# Patient Record
Sex: Female | Born: 1965 | Race: White | Hispanic: No | Marital: Married | Smoking: Never smoker
Health system: Southern US, Community
[De-identification: ages and names within clinical notes are randomized; demographics above are authoritative.]

## PROBLEM LIST (undated history)

## (undated) DIAGNOSIS — R51 Headache: Secondary | ICD-10-CM

## (undated) DIAGNOSIS — N281 Cyst of kidney, acquired: Secondary | ICD-10-CM

## (undated) DIAGNOSIS — Z973 Presence of spectacles and contact lenses: Secondary | ICD-10-CM

## (undated) DIAGNOSIS — N390 Urinary tract infection, site not specified: Secondary | ICD-10-CM

## (undated) DIAGNOSIS — Z87442 Personal history of urinary calculi: Secondary | ICD-10-CM

## (undated) DIAGNOSIS — M1990 Unspecified osteoarthritis, unspecified site: Secondary | ICD-10-CM

## (undated) DIAGNOSIS — E162 Hypoglycemia, unspecified: Secondary | ICD-10-CM

## (undated) DIAGNOSIS — N159 Renal tubulo-interstitial disease, unspecified: Secondary | ICD-10-CM

## (undated) DIAGNOSIS — E039 Hypothyroidism, unspecified: Secondary | ICD-10-CM

## (undated) DIAGNOSIS — D497 Neoplasm of unspecified behavior of endocrine glands and other parts of nervous system: Secondary | ICD-10-CM

## (undated) DIAGNOSIS — R519 Headache, unspecified: Secondary | ICD-10-CM

## (undated) DIAGNOSIS — M419 Scoliosis, unspecified: Secondary | ICD-10-CM

## (undated) DIAGNOSIS — J3089 Other allergic rhinitis: Secondary | ICD-10-CM

## (undated) DIAGNOSIS — K219 Gastro-esophageal reflux disease without esophagitis: Secondary | ICD-10-CM

## (undated) DIAGNOSIS — J189 Pneumonia, unspecified organism: Secondary | ICD-10-CM

## (undated) DIAGNOSIS — R112 Nausea with vomiting, unspecified: Secondary | ICD-10-CM

## (undated) DIAGNOSIS — M4802 Spinal stenosis, cervical region: Secondary | ICD-10-CM

## (undated) DIAGNOSIS — Z9889 Other specified postprocedural states: Secondary | ICD-10-CM

## (undated) DIAGNOSIS — L509 Urticaria, unspecified: Secondary | ICD-10-CM

## (undated) HISTORY — PX: CHOLECYSTECTOMY: SHX55

## (undated) HISTORY — PX: BACK SURGERY: SHX140

## (undated) HISTORY — PX: WISDOM TOOTH EXTRACTION: SHX21

## (undated) HISTORY — PX: NASAL SINUS SURGERY: SHX719

## (undated) HISTORY — PX: TYMPANOSTOMY TUBE PLACEMENT: SHX32

## (undated) HISTORY — PX: ADRENALECTOMY: SHX876

## (undated) HISTORY — PX: TONSILLECTOMY: SUR1361

## (undated) HISTORY — PX: ABDOMINAL HYSTERECTOMY: SHX81

## (undated) HISTORY — PX: OTHER SURGICAL HISTORY: SHX169

---

## 2016-10-28 ENCOUNTER — Other Ambulatory Visit: Payer: Self-pay | Admitting: Neurological Surgery

## 2016-10-28 DIAGNOSIS — M5412 Radiculopathy, cervical region: Secondary | ICD-10-CM

## 2016-11-10 ENCOUNTER — Other Ambulatory Visit: Payer: Self-pay

## 2016-11-11 ENCOUNTER — Ambulatory Visit
Admission: RE | Admit: 2016-11-11 | Discharge: 2016-11-11 | Disposition: A | Discharge status: Home / Self Care | Payer: No Typology Code available for payment source | Source: Ambulatory Visit | Attending: Neurological Surgery | Admitting: Neurological Surgery

## 2016-11-11 ENCOUNTER — Ambulatory Visit
Admission: RE | Admit: 2016-11-11 | Discharge: 2016-11-11 | Disposition: A | Discharge status: Home / Self Care | Payer: Self-pay | Source: Ambulatory Visit | Attending: Neurological Surgery | Admitting: Neurological Surgery

## 2016-11-11 DIAGNOSIS — M5412 Radiculopathy, cervical region: Secondary | ICD-10-CM

## 2016-11-11 MED ORDER — IOPAMIDOL (ISOVUE-M 300) INJECTION 61%
10.0000 mL | Freq: Once | INTRAMUSCULAR | Status: AC | PRN
  Administered 2016-11-11: 10 mL via INTRATHECAL

## 2016-11-11 MED ORDER — DIAZEPAM 5 MG PO TABS
10.0000 mg | ORAL_TABLET | Freq: Once | ORAL | Status: AC
  Administered 2016-11-11: 10 mg via ORAL

## 2016-11-11 MED ORDER — ONDANSETRON HCL 4 MG/2ML IJ SOLN
4.0000 mg | Freq: Once | INTRAMUSCULAR | Status: AC
  Administered 2016-11-11: 4 mg via INTRAMUSCULAR

## 2016-11-11 MED ORDER — MEPERIDINE HCL 100 MG/ML IJ SOLN
100.0000 mg | Freq: Once | INTRAMUSCULAR | Status: AC
  Administered 2016-11-11: 100 mg via INTRAMUSCULAR

## 2016-11-11 NOTE — Progress Notes (Signed)
Pt states she has been off Contrave for the last 2 days.

## 2016-11-11 NOTE — Discharge Instructions (Signed)
Myelogram Discharge Instructions  1. Go home and rest quietly for the next 24 hours.  It is important to lie flat for the next 24 hours.  Get up only to go to the restroom.  You may lie in the bed or on a couch on your back, your stomach, your left side or your right side.  You may have one pillow under your head.  You may have pillows between your knees while you are on your side or under your knees while you are on your back.  2. DO NOT drive today.  Recline the seat as far back as it will go, while still wearing your seat belt, on the way home.  3. You may get up to go to the bathroom as needed.  You may sit up for 10 minutes to eat.  You may resume your normal diet and medications unless otherwise indicated.  Drink lots of extra fluids today and tomorrow.  4. The incidence of headache, nausea, or vomiting is about 5% (one in 20 patients).  If you develop a headache, lie flat and drink plenty of fluids until the headache goes away.  Caffeinated beverages may be helpful.  If you develop severe nausea and vomiting or a headache that does not go away with flat bed rest, call [phone].  5. You may resume normal activities after your 24 hours of bed rest is over; however, do not exert yourself strongly or do any heavy lifting tomorrow. If when you get up you have a headache when standing, go back to bed and force fluids for another 24 hours.  6. Call your physician for a follow-up appointment.  The results of your myelogram will be sent directly to your physician by the following day.  7. If you have any questions or if complications develop after you arrive home, please call [phone].  Discharge instructions have been explained to the patient.  The patient, or the person responsible for the patient, fully understands these instructions.       May resume Contrave on Oct. 12, 2018, after 1:00 pm.

## 2016-11-16 ENCOUNTER — Other Ambulatory Visit: Payer: Self-pay | Admitting: Neurological Surgery

## 2016-11-29 ENCOUNTER — Other Ambulatory Visit: Payer: Self-pay | Admitting: Neurological Surgery

## 2016-12-06 ENCOUNTER — Encounter (HOSPITAL_COMMUNITY)
Admission: RE | Admit: 2016-12-06 | Discharge: 2016-12-06 | Disposition: A | Discharge status: Home / Self Care | Payer: Worker's Compensation | Source: Ambulatory Visit | Attending: Neurological Surgery | Admitting: Neurological Surgery

## 2016-12-06 ENCOUNTER — Other Ambulatory Visit: Payer: Self-pay

## 2016-12-06 ENCOUNTER — Ambulatory Visit (HOSPITAL_COMMUNITY)
Admission: RE | Admit: 2016-12-06 | Discharge: 2016-12-06 | Disposition: A | Discharge status: Home / Self Care | Payer: Worker's Compensation | Source: Ambulatory Visit | Attending: Neurological Surgery | Admitting: Neurological Surgery

## 2016-12-06 ENCOUNTER — Encounter (HOSPITAL_COMMUNITY): Payer: Self-pay

## 2016-12-06 DIAGNOSIS — Z01812 Encounter for preprocedural laboratory examination: Secondary | ICD-10-CM | POA: Diagnosis present

## 2016-12-06 DIAGNOSIS — Z01810 Encounter for preprocedural cardiovascular examination: Secondary | ICD-10-CM | POA: Insufficient documentation

## 2016-12-06 DIAGNOSIS — M4802 Spinal stenosis, cervical region: Secondary | ICD-10-CM

## 2016-12-06 HISTORY — DX: Renal tubulo-interstitial disease, unspecified: N15.9

## 2016-12-06 HISTORY — DX: Headache: R51

## 2016-12-06 HISTORY — DX: Other specified postprocedural states: R11.2

## 2016-12-06 HISTORY — DX: Pneumonia, unspecified organism: J18.9

## 2016-12-06 HISTORY — DX: Spinal stenosis, cervical region: M48.02

## 2016-12-06 HISTORY — DX: Neoplasm of unspecified behavior of endocrine glands and other parts of nervous system: D49.7

## 2016-12-06 HISTORY — DX: Urticaria, unspecified: L50.9

## 2016-12-06 HISTORY — DX: Other specified postprocedural states: Z98.890

## 2016-12-06 HISTORY — DX: Hypothyroidism, unspecified: E03.9

## 2016-12-06 HISTORY — DX: Headache, unspecified: R51.9

## 2016-12-06 HISTORY — DX: Personal history of urinary calculi: Z87.442

## 2016-12-06 HISTORY — DX: Other allergic rhinitis: J30.89

## 2016-12-06 HISTORY — DX: Hypoglycemia, unspecified: E16.2

## 2016-12-06 HISTORY — DX: Gastro-esophageal reflux disease without esophagitis: K21.9

## 2016-12-06 HISTORY — DX: Urinary tract infection, site not specified: N39.0

## 2016-12-06 HISTORY — DX: Cyst of kidney, acquired: N28.1

## 2016-12-06 HISTORY — DX: Unspecified osteoarthritis, unspecified site: M19.90

## 2016-12-06 HISTORY — DX: Scoliosis, unspecified: M41.9

## 2016-12-06 HISTORY — DX: Presence of spectacles and contact lenses: Z97.3

## 2016-12-06 LAB — TYPE AND SCREEN
ABO/RH(D): A NEG
Antibody Screen: NEGATIVE

## 2016-12-06 LAB — BASIC METABOLIC PANEL
Anion gap: 9 (ref 5–15)
BUN: 15 mg/dL (ref 6–20)
CALCIUM: 8.8 mg/dL — AB (ref 8.9–10.3)
CO2: 24 mmol/L (ref 22–32)
CREATININE: 0.92 mg/dL (ref 0.44–1.00)
Chloride: 105 mmol/L (ref 101–111)
GFR calc Af Amer: >60 mL/min (ref >60)
Glucose, Bld: 104 mg/dL — ABNORMAL HIGH (ref 65–99)
POTASSIUM: 4 mmol/L (ref 3.5–5.1)
SODIUM: 138 mmol/L (ref 135–145)

## 2016-12-06 LAB — CBC WITH DIFFERENTIAL/PLATELET
Basophils Absolute: 0 10*3/uL (ref 0.0–0.1)
Basophils Relative: 0 %
EOS PCT: 1 %
Eosinophils Absolute: 0.1 10*3/uL (ref 0.0–0.7)
HEMATOCRIT: 41.8 % (ref 36.0–46.0)
Hemoglobin: 14.3 g/dL (ref 12.0–15.0)
Lymphocytes Relative: 40 %
Lymphs Abs: 2.9 10*3/uL (ref 0.7–4.0)
MCH: 29.9 pg (ref 26.0–34.0)
MCHC: 34.2 g/dL (ref 30.0–36.0)
MCV: 87.3 fL (ref 78.0–100.0)
MONO ABS: 0.5 10*3/uL (ref 0.1–1.0)
Monocytes Relative: 7 %
Neutro Abs: 3.8 10*3/uL (ref 1.7–7.7)
Neutrophils Relative %: 52 %
PLATELETS: 283 10*3/uL (ref 150–400)
RBC: 4.79 MIL/uL (ref 3.87–5.11)
RDW: 12.8 % (ref 11.5–15.5)
WBC: 7.3 10*3/uL (ref 4.0–10.5)

## 2016-12-06 LAB — SURGICAL PCR SCREEN
MRSA, PCR: NEGATIVE
STAPHYLOCOCCUS AUREUS: NEGATIVE

## 2016-12-06 LAB — ABO/RH: ABO/RH(D): A NEG

## 2016-12-06 LAB — PROTIME-INR
INR: 0.92
Prothrombin Time: 12.3 seconds (ref 11.4–15.2)

## 2016-12-06 NOTE — Pre-Procedure Instructions (Signed)
    Paige Hess  12/06/2016      [facility] [place], [address] [place] [address] Phone: [phone] Fax: [phone]    Your procedure is scheduled on Thursday, December 09, 2016  Report to [facility] Admitting at 7:30 A.M.  Call this number if you have problems the morning of surgery:  [phone]   Remember:  Do not eat food or drink liquids after midnight Wednesday, November 7,  Take these medicines the morning of surgery with A SIP OF WATER : cetirizine (ZYRTEC), estradiol (ESTRACE), levothyroxine (SYNTHROID), montelukast (SINGULAIR), ranitidine (ZANTAC),  Stop taking  Naltrexone-Bupropion HCl ER (CONTRAVE),  Aspirin, vitamins, Omega-3 Fatty Acids (FISH OIL), Biotin  and herbal medications. Do not take any NSAIDs ie: Ibuprofen, Advil, Naproxen (NAPROSYN/ Aleve), BC and Goody Powder; stop now.  Do not wear jewelry, make-up or nail polish.  Do not wear lotions, powders, or perfumes, or deodorant.  Do not shave 48 hours prior to surgery.    Do not bring valuables to the hospital.  [facility] is not responsible for any belongings or valuables.  Contacts, dentures or bridgework may not be worn into surgery.  Leave your suitcase in the car.  After surgery it may be brought to your room. For patients admitted to the hospital, discharge time will be determined by your treatment team. Special instructions: Shower the night before surgery and the morning of surgery with CHG. Please read over the following fact sheets that you were given. Pain Booklet, Coughing and Deep Breathing, Blood Transfusion Information and Surgical Site Infection Prevention

## 2016-12-06 NOTE — Progress Notes (Signed)
Pt denies SOB, chest pain, and being under the care of a cardiologist. Pt denies having a stress test, echo and cardiac cath. Pt denies having an EKG and chest x ray within the last year. Pt denies recent labs.  

## 2016-12-09 ENCOUNTER — Observation Stay (HOSPITAL_COMMUNITY)
Admission: RE | Admit: 2016-12-09 | Discharge: 2016-12-10 | Disposition: A | Discharge status: Home / Self Care | Payer: Worker's Compensation | Source: Ambulatory Visit | Attending: Neurological Surgery | Admitting: Neurological Surgery

## 2016-12-09 ENCOUNTER — Encounter (HOSPITAL_COMMUNITY): Payer: Self-pay | Admitting: Neurological Surgery

## 2016-12-09 ENCOUNTER — Inpatient Hospital Stay (HOSPITAL_COMMUNITY): Payer: Worker's Compensation | Admitting: Anesthesiology

## 2016-12-09 ENCOUNTER — Inpatient Hospital Stay (HOSPITAL_COMMUNITY): Payer: Worker's Compensation

## 2016-12-09 ENCOUNTER — Encounter (HOSPITAL_COMMUNITY)
Admission: RE | Disposition: A | Discharge status: Home / Self Care | Payer: Self-pay | Source: Ambulatory Visit | Attending: Neurological Surgery

## 2016-12-09 DIAGNOSIS — M479 Spondylosis, unspecified: Secondary | ICD-10-CM | POA: Diagnosis not present

## 2016-12-09 DIAGNOSIS — Z79890 Hormone replacement therapy (postmenopausal): Secondary | ICD-10-CM | POA: Insufficient documentation

## 2016-12-09 DIAGNOSIS — K219 Gastro-esophageal reflux disease without esophagitis: Secondary | ICD-10-CM | POA: Diagnosis not present

## 2016-12-09 DIAGNOSIS — M50221 Other cervical disc displacement at C4-C5 level: Secondary | ICD-10-CM | POA: Diagnosis not present

## 2016-12-09 DIAGNOSIS — Z79899 Other long term (current) drug therapy: Secondary | ICD-10-CM | POA: Insufficient documentation

## 2016-12-09 DIAGNOSIS — E039 Hypothyroidism, unspecified: Secondary | ICD-10-CM | POA: Diagnosis not present

## 2016-12-09 DIAGNOSIS — M4803 Spinal stenosis, cervicothoracic region: Secondary | ICD-10-CM | POA: Diagnosis not present

## 2016-12-09 DIAGNOSIS — M5013 Cervical disc disorder with radiculopathy, cervicothoracic region: Principal | ICD-10-CM | POA: Insufficient documentation

## 2016-12-09 DIAGNOSIS — Z791 Long term (current) use of non-steroidal anti-inflammatories (NSAID): Secondary | ICD-10-CM | POA: Insufficient documentation

## 2016-12-09 DIAGNOSIS — M4322 Fusion of spine, cervical region: Secondary | ICD-10-CM | POA: Diagnosis present

## 2016-12-09 DIAGNOSIS — M542 Cervicalgia: Secondary | ICD-10-CM | POA: Diagnosis present

## 2016-12-09 DIAGNOSIS — Z419 Encounter for procedure for purposes other than remedying health state, unspecified: Secondary | ICD-10-CM

## 2016-12-09 HISTORY — PX: POSTERIOR CERVICAL FUSION/FORAMINOTOMY: SHX5038

## 2016-12-09 SURGERY — POSTERIOR CERVICAL FUSION/FORAMINOTOMY LEVEL 1
Anesthesia: General | Laterality: Left

## 2016-12-09 MED ORDER — BUPIVACAINE HCL (PF) 0.25 % IJ SOLN
INTRAMUSCULAR | Status: DC | PRN
  Administered 2016-12-09: 5 mL

## 2016-12-09 MED ORDER — POTASSIUM CHLORIDE IN NACL 20-0.9 MEQ/L-% IV SOLN: INTRAVENOUS | Status: DC

## 2016-12-09 MED ORDER — HYDROMORPHONE HCL 1 MG/ML IJ SOLN: 1.0000 mg | Freq: Once | INTRAMUSCULAR | Status: DC

## 2016-12-09 MED ORDER — ONDANSETRON HCL 4 MG/2ML IJ SOLN: 4.0000 mg | Freq: Four times a day (QID) | INTRAMUSCULAR | Status: DC | PRN

## 2016-12-09 MED ORDER — ONDANSETRON HCL 4 MG PO TABS: 4.0000 mg | ORAL_TABLET | Freq: Four times a day (QID) | ORAL | Status: DC | PRN

## 2016-12-09 MED ORDER — 0.9 % SODIUM CHLORIDE (POUR BTL) OPTIME
TOPICAL | Status: DC | PRN
  Administered 2016-12-09: 1000 mL

## 2016-12-09 MED ORDER — FENTANYL CITRATE (PF) 250 MCG/5ML IJ SOLN
INTRAMUSCULAR | Status: AC
  Filled 2016-12-09: qty 5

## 2016-12-09 MED ORDER — ONDANSETRON HCL 4 MG/2ML IJ SOLN
INTRAMUSCULAR | Status: DC | PRN
  Administered 2016-12-09: 4 mg via INTRAVENOUS

## 2016-12-09 MED ORDER — SCOPOLAMINE 1 MG/3DAYS TD PT72
MEDICATED_PATCH | TRANSDERMAL | Status: AC
  Filled 2016-12-09: qty 1

## 2016-12-09 MED ORDER — SODIUM CHLORIDE 0.9 % IV SOLN: 250.0000 mL | INTRAVENOUS | Status: DC

## 2016-12-09 MED ORDER — LACTATED RINGERS IV SOLN
INTRAVENOUS | Status: DC | PRN
  Administered 2016-12-09 (×2): via INTRAVENOUS

## 2016-12-09 MED ORDER — MENTHOL 3 MG MT LOZG: 1.0000 | LOZENGE | OROMUCOSAL | Status: DC | PRN

## 2016-12-09 MED ORDER — THROMBIN (RECOMBINANT) 5000 UNITS EX SOLR
CUTANEOUS | Status: AC
  Filled 2016-12-09: qty 5000

## 2016-12-09 MED ORDER — ACETAMINOPHEN 650 MG RE SUPP: 650.0000 mg | RECTAL | Status: DC | PRN

## 2016-12-09 MED ORDER — PROPOFOL 10 MG/ML IV BOLUS
INTRAVENOUS | Status: AC
  Filled 2016-12-09: qty 20

## 2016-12-09 MED ORDER — DEXAMETHASONE 4 MG PO TABS
4.0000 mg | ORAL_TABLET | Freq: Four times a day (QID) | ORAL | Status: DC
  Administered 2016-12-09 – 2016-12-10 (×2): 4 mg via ORAL
  Filled 2016-12-09 (×2): qty 1

## 2016-12-09 MED ORDER — LEVOTHYROXINE SODIUM 100 MCG PO TABS
100.0000 ug | ORAL_TABLET | Freq: Every day | ORAL | Status: DC
  Administered 2016-12-10: 100 ug via ORAL
  Filled 2016-12-09: qty 1

## 2016-12-09 MED ORDER — SODIUM CHLORIDE 0.9% FLUSH: 3.0000 mL | INTRAVENOUS | Status: DC | PRN

## 2016-12-09 MED ORDER — HYDROCODONE-ACETAMINOPHEN 7.5-325 MG PO TABS
1.0000 | ORAL_TABLET | ORAL | Status: DC | PRN
  Administered 2016-12-09 – 2016-12-10 (×6): 1 via ORAL
  Filled 2016-12-09 (×5): qty 1

## 2016-12-09 MED ORDER — ESTRADIOL 1 MG PO TABS
1.0000 mg | ORAL_TABLET | Freq: Every day | ORAL | Status: DC
  Administered 2016-12-10: 1 mg via ORAL
  Filled 2016-12-09: qty 1

## 2016-12-09 MED ORDER — CEFAZOLIN SODIUM-DEXTROSE 2-4 GM/100ML-% IV SOLN
2.0000 g | INTRAVENOUS | Status: AC
  Administered 2016-12-09: 2 g via INTRAVENOUS
  Filled 2016-12-09: qty 100

## 2016-12-09 MED ORDER — GELATIN ABSORBABLE MT POWD
OROMUCOSAL | Status: DC | PRN
  Administered 2016-12-09: 12:00:00 via TOPICAL

## 2016-12-09 MED ORDER — LIDOCAINE HCL (CARDIAC) 20 MG/ML IV SOLN
INTRAVENOUS | Status: DC | PRN
  Administered 2016-12-09: 100 mg via INTRAVENOUS

## 2016-12-09 MED ORDER — MORPHINE SULFATE (PF) 4 MG/ML IV SOLN: 2.0000 mg | INTRAVENOUS | Status: DC | PRN

## 2016-12-09 MED ORDER — METHOCARBAMOL 1000 MG/10ML IJ SOLN: 500.0000 mg | Freq: Four times a day (QID) | INTRAVENOUS | Status: DC | PRN

## 2016-12-09 MED ORDER — DEXAMETHASONE SODIUM PHOSPHATE 4 MG/ML IJ SOLN
4.0000 mg | Freq: Four times a day (QID) | INTRAMUSCULAR | Status: DC
  Administered 2016-12-09: 4 mg via INTRAVENOUS
  Filled 2016-12-09: qty 1

## 2016-12-09 MED ORDER — DEXTROSE 5 % IV SOLN: INTRAVENOUS | Status: DC | PRN

## 2016-12-09 MED ORDER — FENTANYL CITRATE (PF) 100 MCG/2ML IJ SOLN
INTRAMUSCULAR | Status: AC
  Filled 2016-12-09: qty 2

## 2016-12-09 MED ORDER — ERYTHROMYCIN BASE 250 MG PO TABS
250.0000 mg | ORAL_TABLET | Freq: Every day | ORAL | Status: DC
  Administered 2016-12-10: 250 mg via ORAL
  Filled 2016-12-09: qty 1

## 2016-12-09 MED ORDER — MONTELUKAST SODIUM 10 MG PO TABS
10.0000 mg | ORAL_TABLET | Freq: Every day | ORAL | Status: DC
Start: 2016-12-10 — End: 2016-12-10
  Administered 2016-12-10: 10 mg via ORAL
  Filled 2016-12-09: qty 1

## 2016-12-09 MED ORDER — HYDROMORPHONE HCL 1 MG/ML IJ SOLN
INTRAMUSCULAR | Status: AC
  Filled 2016-12-09: qty 1

## 2016-12-09 MED ORDER — DIAZEPAM 5 MG/ML IJ SOLN
INTRAMUSCULAR | Status: AC
  Filled 2016-12-09: qty 2

## 2016-12-09 MED ORDER — PHENOL 1.4 % MT LIQD: 1.0000 | OROMUCOSAL | Status: DC | PRN

## 2016-12-09 MED ORDER — MIDAZOLAM HCL 5 MG/5ML IJ SOLN
INTRAMUSCULAR | Status: DC | PRN
  Administered 2016-12-09: 2 mg via INTRAVENOUS

## 2016-12-09 MED ORDER — CHLORHEXIDINE GLUCONATE CLOTH 2 % EX PADS: 6.0000 | MEDICATED_PAD | Freq: Once | CUTANEOUS | Status: DC

## 2016-12-09 MED ORDER — PROPOFOL 10 MG/ML IV BOLUS
INTRAVENOUS | Status: DC | PRN
  Administered 2016-12-09: 180 mg via INTRAVENOUS

## 2016-12-09 MED ORDER — HYDROMORPHONE HCL 1 MG/ML IJ SOLN
0.2500 mg | INTRAMUSCULAR | Status: DC | PRN
  Administered 2016-12-09: 1 mg via INTRAVENOUS

## 2016-12-09 MED ORDER — SODIUM CHLORIDE 0.9% FLUSH
3.0000 mL | Freq: Two times a day (BID) | INTRAVENOUS | Status: DC
  Administered 2016-12-09: 3 mL via INTRAVENOUS

## 2016-12-09 MED ORDER — CEFAZOLIN SODIUM-DEXTROSE 2-4 GM/100ML-% IV SOLN
2.0000 g | Freq: Three times a day (TID) | INTRAVENOUS | Status: AC
  Administered 2016-12-09 – 2016-12-10 (×2): 2 g via INTRAVENOUS
  Filled 2016-12-09 (×2): qty 100

## 2016-12-09 MED ORDER — FENTANYL CITRATE (PF) 100 MCG/2ML IJ SOLN
INTRAMUSCULAR | Status: DC | PRN
  Administered 2016-12-09: 150 ug via INTRAVENOUS
  Administered 2016-12-09 (×2): 50 ug via INTRAVENOUS

## 2016-12-09 MED ORDER — PHENYLEPHRINE HCL 10 MG/ML IJ SOLN
INTRAMUSCULAR | Status: DC | PRN
  Administered 2016-12-09: 40 ug via INTRAVENOUS
  Administered 2016-12-09: 80 ug via INTRAVENOUS
  Administered 2016-12-09: 40 ug via INTRAVENOUS

## 2016-12-09 MED ORDER — BUPIVACAINE HCL (PF) 0.25 % IJ SOLN
INTRAMUSCULAR | Status: AC
  Filled 2016-12-09: qty 30

## 2016-12-09 MED ORDER — VANCOMYCIN HCL 1000 MG IV SOLR
INTRAVENOUS | Status: AC
  Filled 2016-12-09: qty 1000

## 2016-12-09 MED ORDER — ROCURONIUM BROMIDE 100 MG/10ML IV SOLN
INTRAVENOUS | Status: DC | PRN
  Administered 2016-12-09: 20 mg via INTRAVENOUS
  Administered 2016-12-09: 70 mg via INTRAVENOUS
  Administered 2016-12-09 (×2): 10 mg via INTRAVENOUS

## 2016-12-09 MED ORDER — METHOCARBAMOL 500 MG PO TABS
ORAL_TABLET | ORAL | Status: AC
  Filled 2016-12-09: qty 1

## 2016-12-09 MED ORDER — SENNA 8.6 MG PO TABS
1.0000 | ORAL_TABLET | Freq: Two times a day (BID) | ORAL | Status: DC
  Administered 2016-12-09: 8.6 mg via ORAL
  Filled 2016-12-09: qty 1

## 2016-12-09 MED ORDER — SUGAMMADEX SODIUM 200 MG/2ML IV SOLN
INTRAVENOUS | Status: DC | PRN
  Administered 2016-12-09: 400 mg via INTRAVENOUS

## 2016-12-09 MED ORDER — DEXAMETHASONE SODIUM PHOSPHATE 10 MG/ML IJ SOLN
10.0000 mg | INTRAMUSCULAR | Status: AC
  Administered 2016-12-09: 10 mg via INTRAVENOUS
  Filled 2016-12-09: qty 1

## 2016-12-09 MED ORDER — DIAZEPAM 5 MG/ML IJ SOLN
2.5000 mg | Freq: Once | INTRAMUSCULAR | Status: AC
  Administered 2016-12-09: 2.5 mg via INTRAVENOUS

## 2016-12-09 MED ORDER — DEXTROSE 5 % IV SOLN
INTRAVENOUS | Status: DC | PRN
  Administered 2016-12-09: 25 ug/min via INTRAVENOUS

## 2016-12-09 MED ORDER — TRIMETHOPRIM 100 MG PO TABS
100.0000 mg | ORAL_TABLET | Freq: Every day | ORAL | Status: DC
  Administered 2016-12-10: 100 mg via ORAL
  Filled 2016-12-09 (×2): qty 1

## 2016-12-09 MED ORDER — ACETAMINOPHEN 325 MG PO TABS
650.0000 mg | ORAL_TABLET | ORAL | Status: DC | PRN
Start: 2016-12-09 — End: 2016-12-10

## 2016-12-09 MED ORDER — SCOPOLAMINE 1 MG/3DAYS TD PT72
MEDICATED_PATCH | TRANSDERMAL | Status: DC | PRN
  Administered 2016-12-09: 1 via TRANSDERMAL

## 2016-12-09 MED ORDER — METHOCARBAMOL 500 MG PO TABS
500.0000 mg | ORAL_TABLET | Freq: Four times a day (QID) | ORAL | Status: DC | PRN
  Administered 2016-12-09 – 2016-12-10 (×4): 500 mg via ORAL
  Filled 2016-12-09 (×3): qty 1

## 2016-12-09 MED ORDER — HYDROCODONE-ACETAMINOPHEN 7.5-325 MG PO TABS
ORAL_TABLET | ORAL | Status: AC
  Filled 2016-12-09: qty 1

## 2016-12-09 MED ORDER — SODIUM CHLORIDE 0.9 % IR SOLN
Status: DC | PRN
  Administered 2016-12-09: 12:00:00

## 2016-12-09 MED ORDER — NALTREXONE-BUPROPION HCL ER 8-90 MG PO TB12: 2.0000 | ORAL_TABLET | Freq: Every day | ORAL | Status: DC

## 2016-12-09 MED ORDER — FENTANYL CITRATE (PF) 100 MCG/2ML IJ SOLN
25.0000 ug | INTRAMUSCULAR | Status: DC | PRN
  Administered 2016-12-09 (×2): 25 ug via INTRAVENOUS

## 2016-12-09 MED ORDER — MIDAZOLAM HCL 2 MG/2ML IJ SOLN
INTRAMUSCULAR | Status: AC
  Filled 2016-12-09: qty 2

## 2016-12-09 SURGICAL SUPPLY — 53 items
BAG DECANTER FOR FLEXI CONT (MISCELLANEOUS) ×2 IMPLANT
BENZOIN TINCTURE PRP APPL 2/3 (GAUZE/BANDAGES/DRESSINGS) ×2 IMPLANT
BLADE CLIPPER SURG (BLADE) IMPLANT
BUR MATCHSTICK NEURO 3.0 LAGG (BURR) IMPLANT
CANISTER SUCT 3000ML PPV (MISCELLANEOUS) ×2 IMPLANT
CAP LOCKING (Cap) ×2 IMPLANT
CARTRIDGE OIL MAESTRO DRILL (MISCELLANEOUS) ×1 IMPLANT
DIFFUSER DRILL AIR PNEUMATIC (MISCELLANEOUS) ×2 IMPLANT
DRAPE C-ARM 42X72 X-RAY (DRAPES) ×4 IMPLANT
DRAPE LAPAROTOMY 100X72 PEDS (DRAPES) ×2 IMPLANT
DRAPE POUCH INSTRU U-SHP 10X18 (DRAPES) ×2 IMPLANT
DRSG OPSITE POSTOP 3X4 (GAUZE/BANDAGES/DRESSINGS) ×2 IMPLANT
DURAPREP 26ML APPLICATOR (WOUND CARE) ×2 IMPLANT
ELECT REM PT RETURN 9FT ADLT (ELECTROSURGICAL) ×2
ELECTRODE REM PT RTRN 9FT ADLT (ELECTROSURGICAL) ×1 IMPLANT
EVACUATOR 1/8 PVC DRAIN (DRAIN) IMPLANT
GAUZE SPONGE 4X4 16PLY XRAY LF (GAUZE/BANDAGES/DRESSINGS) IMPLANT
GLOVE BIO SURGEON STRL SZ7 (GLOVE) IMPLANT
GLOVE BIO SURGEON STRL SZ8 (GLOVE) ×2 IMPLANT
GLOVE BIOGEL PI IND STRL 7.0 (GLOVE) IMPLANT
GLOVE BIOGEL PI INDICATOR 7.0 (GLOVE)
GOWN STRL REUS W/ TWL LRG LVL3 (GOWN DISPOSABLE) IMPLANT
GOWN STRL REUS W/ TWL XL LVL3 (GOWN DISPOSABLE) ×1 IMPLANT
GOWN STRL REUS W/TWL 2XL LVL3 (GOWN DISPOSABLE) IMPLANT
GOWN STRL REUS W/TWL LRG LVL3 (GOWN DISPOSABLE)
GOWN STRL REUS W/TWL XL LVL3 (GOWN DISPOSABLE) ×1
HEMOSTAT POWDER KIT SURGIFOAM (HEMOSTASIS) ×2 IMPLANT
IMPL QUARTEX 3.5X14MM (Neuro Prosthesis/Implant) ×1 IMPLANT
IMPLANT QUARTEX 3.5X14MM (Neuro Prosthesis/Implant) ×2 IMPLANT
KIT BASIN OR (CUSTOM PROCEDURE TRAY) ×2 IMPLANT
KIT ROOM TURNOVER OR (KITS) ×2 IMPLANT
LOCKING CAP (Cap) ×4 IMPLANT
MARKER SKIN DUAL TIP RULER LAB (MISCELLANEOUS) ×2 IMPLANT
NEEDLE HYPO 25X1 1.5 SAFETY (NEEDLE) ×2 IMPLANT
NEEDLE SPNL 20GX3.5 QUINCKE YW (NEEDLE) IMPLANT
NS IRRIG 1000ML POUR BTL (IV SOLUTION) ×2 IMPLANT
OIL CARTRIDGE MAESTRO DRILL (MISCELLANEOUS) ×2
PACK LAMINECTOMY NEURO (CUSTOM PROCEDURE TRAY) ×2 IMPLANT
PAD ARMBOARD 7.5X6 YLW CONV (MISCELLANEOUS) ×2 IMPLANT
PIN MAYFIELD SKULL DISP (PIN) ×2 IMPLANT
PUTTY DBM ALLOSYNC PURE 5CC (Putty) ×2 IMPLANT
ROD ELLIPSE 40MMX3.5 (Rod) ×2 IMPLANT
SCREW QUARTEX 4.0X24MM POLY (Screw) ×4 IMPLANT
SPONGE LAP 4X18 X RAY DECT (DISPOSABLE) IMPLANT
SPONGE SURGIFOAM ABS GEL SZ50 (HEMOSTASIS) IMPLANT
STRIP CLOSURE SKIN 1/2X4 (GAUZE/BANDAGES/DRESSINGS) ×2 IMPLANT
SUT VIC AB 0 CT1 18XCR BRD8 (SUTURE) ×1 IMPLANT
SUT VIC AB 0 CT1 8-18 (SUTURE) ×1
SUT VIC AB 2-0 CP2 18 (SUTURE) ×2 IMPLANT
SUT VIC AB 3-0 SH 8-18 (SUTURE) ×2 IMPLANT
TOWEL GREEN STERILE (TOWEL DISPOSABLE) ×2 IMPLANT
TOWEL GREEN STERILE FF (TOWEL DISPOSABLE) ×2 IMPLANT
WATER STERILE IRR 1000ML POUR (IV SOLUTION) ×2 IMPLANT

## 2016-12-09 NOTE — Transfer of Care (Signed)
Immediate Anesthesia Transfer of Care Note  Patient: Paige Hess  Procedure(s) Performed: Posterior Cervical Fusion with lateral mass fixation - Cervical Seven-Thoracic One, foraminotomy Cerival Seven-Thoracic One left (Left )  Patient Location: PACU  Anesthesia Type:General  Level of Consciousness: awake, alert  and oriented  Airway & Oxygen Therapy: Patient Spontanous Breathing and Patient connected to nasal cannula oxygen  Post-op Assessment: Report given to RN, Post -op Vital signs reviewed and stable and Patient moving all extremities X 4  Post vital signs: Reviewed and stable  Last Vitals:  Vitals:   12/09/16 0754  BP: (!) 137/91  Pulse: 81  Temp: 36.6 C  SpO2: 100%    Last Pain:  Vitals:   12/09/16 0832  TempSrc:   PainSc: 4          Complications: No apparent anesthesia complications

## 2016-12-09 NOTE — Anesthesia Preprocedure Evaluation (Addendum)
Anesthesia Evaluation  Patient identified by MRN, date of birth, ID band Patient awake    History of Anesthesia Complications (+) PONV  Airway Mallampati: II  TM Distance: >3 FB Neck ROM: Full    Dental  (+) Teeth Intact, Dental Advisory Given   Pulmonary pneumonia,    breath sounds clear to auscultation       Cardiovascular negative cardio ROS   Rhythm:Regular Rate:Normal     Neuro/Psych  Headaches,    GI/Hepatic GERD  ,  Endo/Other  Hypothyroidism   Renal/GU Renal disease     Musculoskeletal  (+) Arthritis ,   Abdominal   Peds  Hematology   Anesthesia Other Findings   Reproductive/Obstetrics                           Anesthesia Physical Anesthesia Plan  ASA: III  Anesthesia Plan: General   Post-op Pain Management:    Induction: Intravenous  PONV Risk Score and Plan: 3 and Treatment may vary due to age or medical condition, Ondansetron, Dexamethasone and Propofol infusion  Airway Management Planned: Oral ETT  Additional Equipment:   Intra-op Plan:   Post-operative Plan: Extubation in OR  Informed Consent: I have reviewed the patients History and Physical, chart, labs and discussed the procedure including the risks, benefits and alternatives for the proposed anesthesia with the patient or authorized representative who has indicated his/her understanding and acceptance.   Dental advisory given  Plan Discussed with: Anesthesiologist and CRNA  Anesthesia Plan Comments:         Anesthesia Quick Evaluation

## 2016-12-09 NOTE — Anesthesia Postprocedure Evaluation (Signed)
Anesthesia Post Note  Patient: Paige Hess  Procedure(s) Performed: Posterior Cervical Fusion with lateral mass fixation - Cervical Seven-Thoracic One, foraminotomy Cerival Seven-Thoracic One left (Left )     Patient location during evaluation: PACU Anesthesia Type: General Level of consciousness: awake Pain management: pain level controlled Vital Signs Assessment: post-procedure vital signs reviewed and stable Respiratory status: spontaneous breathing Cardiovascular status: stable Anesthetic complications: no    Last Vitals:  Vitals:   12/09/16 0754 12/09/16 1306  BP: (!) 137/91   Pulse: 81   Temp: 36.6 C (!) 36.3 C  SpO2: 100%     Last Pain:  Vitals:   12/09/16 0832  TempSrc:   PainSc: 4                  Mickala Laton

## 2016-12-09 NOTE — Op Note (Signed)
12/09/2016  1:07 PM  PATIENT:  Paige Hess  51 y.o. 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 Left C7-T1 decompressive hemi-laminectomy, medial facetectomy and foraminotomy for decompression of the C8 nerve root, 2. Left C7-T1 nonsegmental fixation with globus lateral mass screw at C7 and pedicle screws at T1, 3. Posterior cervical fusion C6 to T1 on the left utilizing local autograft and morcellized allograft soaked with bone marrow aspirate  SURGEON:  Sherley Bounds, MD  ASSISTANTS: Meyran FNP  ANESTHESIA:   General  EBL: 50 ml  Total I/O In: 1000 [I.V.:1000] Out: 56 [Blood:50]  BLOOD ADMINISTERED: none  DRAINS: none  SPECIMEN:  none  INDICATION FOR PROCEDURE: This patient presented with severe left arm pain in the C8 distribution. Imaging showed significant spondylosis and foraminal stenosis at C7-T1 on the left. She had previous surgery at C6-7 and C7-T1 on the right . The patient tried conservative measures without relief. Pain was debilitating. Recommended C7-T1 decompression and instrumented fusion on the left. Patient understood the risks, benefits, and alternatives and potential outcomes and wished to proceed.  PROCEDURE DETAILS: The patient was brought to the operating room. Generalized endotracheal anesthesia was induced. The patient was affixed a 3 point [place] headrest and rolled into the prone position on chest rolls. All pressure points were padded. The posterior cervical region was cleaned and prepped with DuraPrep and then draped in the usual sterile fashion. 7 cc of local anesthesia was injected and a dorsal midline incision made in the posterior cervical region and carried down to the cervical fascia. The fascia was opened and the paraspinous musculature was taken down to expose C C6-T1 on the left. The left C6-7 facet was quite arthritic . Intraoperative  fluoroscopy ( we spent considerable time, because of body habitus, with AP and lateral fluoroscopy confirming C7-T1 as our level . This was done prior to decompression and instrumentation placement and after. Everybody in the room worked to confirm the correct level with imaging as best we could ) confirmed my level and then the dissection was carried out over the lateral facets. I localized the midpoint of each lateral mass and marked a region 1 mm medial to the midpoint of the lateral mass of C7, and then drilled in an upward and outward direction into the safe zone of each lateral mass. I drilled to a depth of 14 mm and then checked my drill hole with a ball probe. I then placed a 14 mm lateral mass screw into the safe zone of the C7 lateral mass on the left until they were 2 fingers tight. A left C7-T1 laminectomy, medial facetectomy and foraminotomy was performed to decompress the left C8 nerve root. There was significant spondylosis within the foramen. I then palpated the T1 pedicle to determine the starting point utilizing surface anatomy. I drilled the left T1 pedicle to a depth of 24 mm, palpated with a ball probe, and then placed a 4.0 x 24 mm pedicle screw at T1 on the left. I then decorticated the lateral masses and the facet joints and packed them with local autograft and morcellized allograft to perform arthrodesis from C6-T1 on the left. I then placed a rod into the multiaxial screw heads of the screws and locked these into position with the locking caps and anti-torque device. I then checked the final construct with AP/Lat fluoroscopy. I irrigated with saline solution containing bacitracin.After hemostasis was  achieved I closed the muscle and the fascia with 0 Vicryl, subcutaneous tissue with 2-0 Vicryl, and the subcuticular tissue with 3-0 Vicryl. The skin was closed with benzoin and Steri-Strips. A sterile dressing was applied, the patient was turned to the supine position and taken out of the  headrest, awakened from general anesthesia and transferred to the recovery room in stable condition. At the end of the procedure all sponge, needle and instrument counts were correct.    PLAN OF CARE: Admit to inpatient   PATIENT DISPOSITION:  PACU - hemodynamically stable.   Delay start of Pharmacological VTE agent (>24hrs) due to surgical blood loss or risk of bleeding:  yes

## 2016-12-09 NOTE — H&P (Signed)
Subjective:   Patient is a 51 y.o. female admitted for neck and l arm apin. The patient first presented to me with complaints of neck pain, shooting pains in the arm(s) and numbness of the arm(s). Onset of symptoms was several months ago. The pain is described as sharp and stabbing and occurs all day. The pain is rated severe, and is located in the neck and radiates to the LUE. The symptoms have been progressive. Symptoms are exacerbated by extending head backwards, and are relieved by none.  Previous work up includes CT of cervical spine, results: spinal stenosis.  Past Medical History:  Diagnosis Date  . Adrenal tumor   . Arthritis   . Chronic UTI   . Environmental and seasonal allergies   . GERD (gastroesophageal reflux disease)   . Headache   . History of kidney stones   . 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 Hypoglycemia   . Hypothyroidism   . Kidney infection   . Pneumonia    as a child   . PONV (postoperative nausea and vomiting)   . Renal cyst, right     PMH: has resolved  . Scoliosis   . Stenosis, cervical spine   . Wears contact lenses     Past Surgical History:  Procedure Laterality Date  . ABDOMINAL HYSTERECTOMY     partial  . ADRENALECTOMY    . BACK SURGERY     cervical laminectomy  . CHOLECYSTECTOMY    . colonoscopy    . NASAL SINUS SURGERY    . TONSILLECTOMY    . TYMPANOSTOMY TUBE PLACEMENT    . WISDOM TOOTH EXTRACTION      Allergies  Allergen Reactions  . Minocin [Minocycline Hcl] Hives    Social History   Tobacco Use  . Smoking status: Never Smoker  . Smokeless tobacco: Never Used  Substance Use Topics  . Alcohol use: No    Frequency: Never    Family History  Problem Relation Age of Onset  . Aneurysm Mother   . Hypertension Mother   . Diabetes Father   . Hypothyroidism Father   . Alzheimer's disease Father   . High Cholesterol Brother   . Hypertension Brother   . Hypertension Brother   . High Cholesterol Brother    Prior to  Admission medications   Medication Sig Start Date End Date Taking? Authorizing Provider  Biotin 10000 MCG TABS Take 1 tablet by mouth daily.   Yes [provider]  cetirizine (ZYRTEC) 10 MG tablet Take 10 mg by mouth daily.   Yes [provider]  erythromycin (E-MYCIN) 250 MG tablet Take 250 mg by mouth daily.   Yes [provider]  estradiol (ESTRACE) 1 MG tablet Take 1 mg by mouth daily.   Yes [provider]  Ivermectin (SOOLANTRA) 1 % CREA Apply 1 application topically daily.   Yes [provider]  levothyroxine (SYNTHROID, LEVOTHROID) 100 MCG tablet Take 100 mcg by mouth daily before breakfast.   Yes [provider]  mometasone (NASONEX) 50 MCG/ACT nasal spray Place 1 spray into the nose 2 (two) times daily.   Yes [provider]  montelukast (SINGULAIR) 10 MG tablet Take 10 mg by mouth daily.   Yes [provider]  Multiple Vitamin (MULTIVITAMIN WITH MINERALS) TABS tablet Take 1 tablet by mouth daily.   Yes [provider]  Naltrexone-Bupropion HCl ER (CONTRAVE) 8-90 MG TB12 Take 2 tablets by mouth daily.  Yes [provider]  naproxen (NAPROSYN) 500 MG tablet Take 500 mg by mouth 2 (two) times daily with a meal.   Yes [provider]  Omega-3 Fatty Acids (FISH OIL) 1200 MG CAPS Take 1,200 mg by mouth daily.   Yes [provider]  ranitidine (ZANTAC) 150 MG tablet Take 150 mg by mouth daily.   Yes [provider]  trimethoprim (TRIMPEX) 100 MG tablet Take 100 mg by mouth daily.   Yes [provider]     Review of Systems  Positive ROS: neg  All other systems have been reviewed and were otherwise negative with the exception of those mentioned in the HPI and as above.  Objective: Vital signs in last 24 hours: Temp:  [97.9 F (36.6 C)] 97.9 F (36.6 C) (11/08 0754) Pulse Rate:  [81] 81 (11/08 0754) BP: (137)/(91) 137/91 (11/08 0754) SpO2:  [100 %] 100 %  (11/08 0754) Weight:  [94.8 kg (209 lb)] 94.8 kg (209 lb) (11/08 0754)  General Appearance: Alert, cooperative, no distress, appears stated age Head: Normocephalic, without obvious abnormality, atraumatic Eyes: PERRL, conjunctiva/corneas clear, EOM's intact      Neck: Supple, symmetrical, trachea midline, Back: Symmetric, no curvature, ROM normal, no CVA tenderness Lungs:  respirations unlabored Heart: Regular rate and rhythm Abdomen: Soft, non-tender Extremities: Extremities normal, atraumatic, no cyanosis or edema Pulses: 2+ and symmetric all extremities Skin: Skin color, texture, turgor normal, no rashes or lesions  NEUROLOGIC:  Mental status: Alert and oriented x4, no aphasia, good attention span, fund of knowledge and memory  Motor Exam - grossly normal Sensory Exam - grossly normal Reflexes: 1+ Coordination - grossly normal Gait - grossly normal Balance - grossly normal Cranial Nerves: I: smell Not tested  II: visual acuity  OS: nl    OD: nl  II: visual fields Full to confrontation  II: pupils Equal, round, reactive to light  III,VII: ptosis None  III,IV,VI: extraocular muscles  Full ROM  V: mastication Normal  V: facial light touch sensation  Normal  V,VII: corneal reflex  Present  VII: facial muscle function - upper  Normal  VII: facial muscle function - lower Normal  VIII: hearing Not tested  IX: soft palate elevation  Normal  IX,X: gag reflex Present  XI: trapezius strength  5/5  XI: sternocleidomastoid strength 5/5  XI: neck flexion strength  5/5  XII: tongue strength  Normal    Data Review Lab Results  Component Value Date   WBC 7.3 12/06/2016   HGB 14.3 12/06/2016   HCT 41.8 12/06/2016   MCV 87.3 12/06/2016   PLT 283 12/06/2016   Lab Results  Component Value Date   NA 138 12/06/2016   K 4.0 12/06/2016   CL 105 12/06/2016   CO2 24 12/06/2016   BUN 15 12/06/2016   CREATININE 0.92 12/06/2016   GLUCOSE 104 (H) 12/06/2016   Lab Results   Component Value Date   INR 0.92 12/06/2016    Assessment:   Cervical neck pain with herniated nucleus pulposus/ spondylosis/ stenosis at C7T1 L. Patient has failed conservative therapy. Planned surgery : L C7-T1 foraminotomy and fusion  Plan:   I explained the condition and procedure to the patient and answered any questions.  Patient wishes to proceed with procedure as planned. Understands risks/ benefits/ and expected or typical outcomes.  Mardelle Pandolfi S 12/09/2016 9:00 AM

## 2016-12-09 NOTE — Anesthesia Procedure Notes (Signed)
Procedure Name: Intubation Date/Time: 12/09/2016 11:23 AM Performed by: Gaylene Brooks, CRNA Pre-anesthesia Checklist: Patient identified, Emergency Drugs available, Suction available and Patient being monitored Patient Re-evaluated:Patient Re-evaluated prior to induction Oxygen Delivery Method: Circle System Utilized Preoxygenation: Pre-oxygenation with 100% oxygen Induction Type: IV induction Ventilation: Mask ventilation without difficulty Laryngoscope Size: Miller and 2 Grade View: Grade II Tube type: Oral Tube size: 7.0 mm Number of attempts: 1 Airway Equipment and Method: Stylet and Oral airway Placement Confirmation: ETT inserted through vocal cords under direct vision,  positive ETCO2 and breath sounds checked- equal and bilateral Secured at: 22 cm Tube secured with: Tape Dental Injury: Teeth and Oropharynx as per pre-operative assessment

## 2016-12-10 ENCOUNTER — Encounter (HOSPITAL_COMMUNITY): Payer: Self-pay | Admitting: Neurological Surgery

## 2016-12-10 DIAGNOSIS — M5013 Cervical disc disorder with radiculopathy, cervicothoracic region: Secondary | ICD-10-CM | POA: Diagnosis not present

## 2016-12-10 MED ORDER — OXYCODONE-ACETAMINOPHEN 5-325 MG PO TABS: 1.0000 | ORAL_TABLET | Freq: Four times a day (QID) | ORAL | 0 refills | Status: AC | PRN

## 2016-12-10 MED ORDER — METHOCARBAMOL 500 MG PO TABS: 500.0000 mg | ORAL_TABLET | Freq: Four times a day (QID) | ORAL | 0 refills | Status: AC | PRN

## 2016-12-10 NOTE — Progress Notes (Signed)
Patient alert and oriented, mae's well, voiding adequate amount of urine, swallowing without difficulty, c/o mild pain at time of discharge. Patient discharged home with family. Script and discharged instructions given to patient. Patient and family stated understanding of instructions given. Patient has an appointment with Dr. Jones   

## 2016-12-10 NOTE — Discharge Summary (Signed)
Physician Discharge Summary  Patient ID: Paige Hess MRN: [id number] DOB/AGE: [birth year] 51 y.o.  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 Left C7-T1 nonsegmental fixation with globus lateral mass screw at C7 and pedicle screws at T1, 3. Posterior cervical fusion C6 to T1 on the left. The patient tolerated the procedure well and was taken to the recovery room and then to the floor in stable condition. The hospital course was routine. There were no complications. The wound remained clean dry and intact. Pt had appropriate neck soreness. No complaints of new pain or new N/T/W. The patient remained afebrile with stable vital signs, and tolerated a regular diet. The patient continued to increase activities, and pain was well controlled with oral pain medications.   Consults: None  Significant Diagnostic Studies:  Results for orders placed or performed during the hospital encounter of 12/06/16  Surgical pcr screen  Result Value Ref Range   MRSA, PCR NEGATIVE NEGATIVE   Staphylococcus aureus NEGATIVE NEGATIVE  Basic metabolic panel  Result Value Ref Range   Sodium 138 135 - 145 mmol/L   Potassium 4.0 3.5 - 5.1 mmol/L   Chloride 105 101 - 111 mmol/L   CO2 24 22 - 32 mmol/L   Glucose, Bld 104 (H) 65 - 99 mg/dL   BUN 15 6 - 20 mg/dL   Creatinine, Ser 0.92 0.44 - 1.00 mg/dL   Calcium 8.8 (L) 8.9 - 10.3 mg/dL   GFR calc non Af Amer >60 >60 mL/min   GFR calc Af Amer >60 >60 mL/min   Anion gap 9 5 - 15  CBC WITH DIFFERENTIAL  Result Value Ref Range   WBC 7.3 4.0 - 10.5 K/uL   RBC 4.79  3.87 - 5.11 MIL/uL   Hemoglobin 14.3 12.0 - 15.0 g/dL   HCT 41.8 36.0 - 46.0 %   MCV 87.3 78.0 - 100.0 fL   MCH 29.9 26.0 - 34.0 pg   MCHC 34.2 30.0 - 36.0 g/dL   RDW 12.8 11.5 - 15.5 %   Platelets 283 150 - 400 K/uL   Neutrophils Relative % 52 %   Neutro Abs 3.8 1.7 - 7.7 K/uL   Lymphocytes Relative 40 %   Lymphs Abs 2.9 0.7 - 4.0 K/uL   Monocytes Relative 7 %   Monocytes Absolute 0.5 0.1 - 1.0 K/uL   Eosinophils Relative 1 %   Eosinophils Absolute 0.1 0.0 - 0.7 K/uL   Basophils Relative 0 %   Basophils Absolute 0.0 0.0 - 0.1 K/uL  Protime-INR  Result Value Ref Range   Prothrombin Time 12.3 11.4 - 15.2 seconds   INR 0.92   Type and screen [place]  Result Value Ref Range   ABO/RH(D) A NEG    Antibody Screen NEG    Sample Expiration 12/20/2016    Extend sample reason NO TRANSFUSIONS OR PREGNANCY IN THE PAST 3 MONTHS   ABO/Rh  Result Value Ref Range   ABO/RH(D) A NEG     Chest 2 View  Result Date: 12/07/2016 CLINICAL DATA:  Preoperative evaluation for cervical fusion EXAM: CHEST  2 VIEW COMPARISON:  None. FINDINGS: Lungs are clear.  Heart size and pulmonary vascularity are normal. No adenopathy. There is slight lower thoracic levoscoliosis. IMPRESSION: No edema or consolidation. Electronically Signed   By: Lowella Grip III M.D.   On: 12/07/2016 08:35   Dg Cervical Spine 2-3 Views  Result Date: 12/09/2016 CLINICAL DATA:  Posterior cervical fusion with lateral mass fixation of C7-T1. [place] OR-21. Fluoro time: 63minutes, 21seconds. EXAM: DG C-ARM 61-120 MIN; CERVICAL SPINE - 2-3 VIEW COMPARISON:  Cervical spine CT on 11/11/2016 FINDINGS: 12:39: Posterior retractor is in place. Screws traverse the left lateral masses at C7 and T1. 12:37: Posterior retractor is in place. A surgical instrument overlies the posterior first rib. 12:42: A surgical instrument is identified posterior to the C6 vertebral body. IMPRESSION: Intraoperative localization.  Electronically Signed   By: Nolon Nations M.D.   On: 12/09/2016 13:11   Ct Cervical Spine W Contrast  Result Date: 11/11/2016 CLINICAL DATA:  Left C8 radiculopathy. Left-sided neck pain extending into the left ring and pinky fingers. FLUOROSCOPY TIME:  Radiation Exposure Index (as provided by the fluoroscopic device): 100.51 uGy*m2 Fluoroscopy Time:  26 seconds Number of Acquired Images:  10 PROCEDURE: LUMBAR PUNCTURE FOR CERVICAL MYELOGRAM After thorough discussion of risks and benefits of the procedure including bleeding, infection, injury to nerves, blood vessels, adjacent structures as well as headache and CSF leak, written and oral informed consent was obtained. Consent was obtained by Dr. San Morelle. We discussed the high likelihood of obtaining a diagnostic study. Patient was positioned prone on the fluoroscopy table. Local anesthesia was provided with 1% lidocaine without epinephrine after prepped and draped in the usual sterile fashion. Puncture was performed at L2-3 using a 3 1/2 inch 22-gauge spinal needle via right paramedian approach. Using a single pass through the dura, the needle was placed within the thecal sac, with return of clear CSF. 10 mL of Isovue M 300 was injected into the thecal sac, with normal opacification of the nerve roots and cauda equina consistent with free flow within the subarachnoid space. The patient was then moved to the trendelenburg position and contrast flowed into the Cervical spine region. I personally performed the lumbar puncture and administered the intrathecal contrast. I also personally supervised acquisition of the myelogram images. TECHNIQUE: Contiguous axial images were obtained through the Cervical spine after the intrathecal infusion of infusion. Coronal and sagittal reconstructions were obtained of the axial image sets. FINDINGS: CERVICAL MYELOGRAM FINDINGS: AP alignment is anatomic. A broad-based disc osteophyte complex is present C5-6, C6-7,  and C7-T1. There is asymmetric blunting of right-sided nerve roots at C3-4 and T1-2. a there is focal blunting of the left C8 nerve root. There is no significant change with standing, flexion, or extension. Alignment is maintained during a limited range of flexion and extension. CT CERVICAL MYELOGRAM FINDINGS: Cervical spine is imaged from the skullbase through T2-3. AP alignment is anatomic. The soft tissues the neck are unremarkable. Lung apices are clear. C2-3:  Negative. C3-4: Mild right-sided facet hypertrophy is present without significant stenosis. C4-5: A shallow disc protrusion is present without significant stenosis. C5-6: A broad-based disc osteophyte complex partially effaces the ventral CSF. Uncovertebral spurring contributes to mild foraminal narrowing bilaterally, worse on the right. C6-7: Right laminectomy is present. No residual recurrent stenosis is present. C7-T1: Right laminectomy is noted. A retroisthmic cleft is present. There is associated left-sided facet hypertrophy and spurring. This contributes to moderate left foraminal stenosis. The central canal is patent. IMPRESSION: 1. Left retroisthmic cleft with associated left-sided facet spurring and  moderate left foraminal stenosis, likely impacting the left C8 nerve root. 2. Right laminectomies at C6 and C7 without residual or recurrent right-sided stenosis. 3. Mild foraminal narrowing bilaterally at C5-6 due to uncovertebral disease is worse on the right. 4. Shallow disc protrusion at C4-5 without significant stenosis. Electronically Signed   By: San Morelle M.D.   On: 11/11/2016 14:10   Dg C-arm 1-60 Min  Result Date: 12/09/2016 CLINICAL DATA:  Posterior cervical fusion with lateral mass fixation of C7-T1. [place] OR-21. Fluoro time: 5minutes, 21seconds. EXAM: DG C-ARM 61-120 MIN; CERVICAL SPINE - 2-3 VIEW COMPARISON:  Cervical spine CT on 11/11/2016 FINDINGS: 12:39: Posterior retractor is in place. Screws traverse the left  lateral masses at C7 and T1. 12:37: Posterior retractor is in place. A surgical instrument overlies the posterior first rib. 12:42: A surgical instrument is identified posterior to the C6 vertebral body. IMPRESSION: Intraoperative localization. Electronically Signed   By: Nolon Nations M.D.   On: 12/09/2016 13:11   Dg Myelography Lumbar Inj Cervical  Result Date: 11/11/2016 CLINICAL DATA:  Left C8 radiculopathy. Left-sided neck pain extending into the left ring and pinky fingers. FLUOROSCOPY TIME:  Radiation Exposure Index (as provided by the fluoroscopic device): 100.51 uGy*m2 Fluoroscopy Time:  26 seconds Number of Acquired Images:  10 PROCEDURE: LUMBAR PUNCTURE FOR CERVICAL MYELOGRAM After thorough discussion of risks and benefits of the procedure including bleeding, infection, injury to nerves, blood vessels, adjacent structures as well as headache and CSF leak, written and oral informed consent was obtained. Consent was obtained by Dr. San Morelle. We discussed the high likelihood of obtaining a diagnostic study. Patient was positioned prone on the fluoroscopy table. Local anesthesia was provided with 1% lidocaine without epinephrine after prepped and draped in the usual sterile fashion. Puncture was performed at L2-3 using a 3 1/2 inch 22-gauge spinal needle via right paramedian approach. Using a single pass through the dura, the needle was placed within the thecal sac, with return of clear CSF. 10 mL of Isovue M 300 was injected into the thecal sac, with normal opacification of the nerve roots and cauda equina consistent with free flow within the subarachnoid space. The patient was then moved to the trendelenburg position and contrast flowed into the Cervical spine region. I personally performed the lumbar puncture and administered the intrathecal contrast. I also personally supervised acquisition of the myelogram images. TECHNIQUE: Contiguous axial images were obtained through the Cervical  spine after the intrathecal infusion of infusion. Coronal and sagittal reconstructions were obtained of the axial image sets. FINDINGS: CERVICAL MYELOGRAM FINDINGS: AP alignment is anatomic. A broad-based disc osteophyte complex is present C5-6, C6-7, and C7-T1. There is asymmetric blunting of right-sided nerve roots at C3-4 and T1-2. a there is focal blunting of the left C8 nerve root. There is no significant change with standing, flexion, or extension. Alignment is maintained during a limited range of flexion and extension. CT CERVICAL MYELOGRAM FINDINGS: Cervical spine is imaged from the skullbase through T2-3. AP alignment is anatomic. The soft tissues the neck are unremarkable. Lung apices are clear. C2-3:  Negative. C3-4: Mild right-sided facet hypertrophy is present without significant stenosis. C4-5: A shallow disc protrusion is present without significant stenosis. C5-6: A broad-based disc osteophyte complex partially effaces the ventral CSF. Uncovertebral spurring contributes to mild foraminal narrowing bilaterally, worse on the right. C6-7: Right laminectomy is present. No residual recurrent stenosis is present. C7-T1: Right laminectomy is noted. A retroisthmic cleft is present. There is associated left-sided  facet hypertrophy and spurring. This contributes to moderate left foraminal stenosis. The central canal is patent. IMPRESSION: 1. Left retroisthmic cleft with associated left-sided facet spurring and moderate left foraminal stenosis, likely impacting the left C8 nerve root. 2. Right laminectomies at C6 and C7 without residual or recurrent right-sided stenosis. 3. Mild foraminal narrowing bilaterally at C5-6 due to uncovertebral disease is worse on the right. 4. Shallow disc protrusion at C4-5 without significant stenosis. Electronically Signed   By: San Morelle M.D.   On: 11/11/2016 14:10    Antibiotics:  Anti-infectives (From admission, onward)   Start     Dose/Rate Route Frequency  Ordered Stop   12/10/16 1000  erythromycin (E-MYCIN) tablet 250 mg     250 mg Oral Daily 12/09/16 1419     12/09/16 2000  ceFAZolin (ANCEF) IVPB 2g/100 mL premix     2 g 200 mL/hr over 30 Minutes Intravenous Every 8 hours 12/09/16 1419 12/10/16 0333   12/09/16 1530  trimethoprim (TRIMPEX) tablet 100 mg     100 mg Oral Daily 12/09/16 1419     12/09/16 1159  bacitracin 50,000 Units in sodium chloride irrigation 0.9 % 500 mL irrigation  Status:  Discontinued       As needed 12/09/16 1200 12/09/16 1302   12/09/16 0814  ceFAZolin (ANCEF) IVPB 2g/100 mL premix     2 g 200 mL/hr over 30 Minutes Intravenous On call to O.R. 12/09/16 0814 12/09/16 1140      Discharge Exam: Blood pressure 125/72, pulse 80, temperature 98.5 F (36.9 C), temperature source Oral, resp. rate 18, weight 94.8 kg (209 lb), SpO2 99 %. Neurologic: Grossly normal Ambulating and voiding well  Discharge Medications:   Allergies as of 12/10/2016      Reactions   Minocin [minocycline Hcl] Hives      Medication List    TAKE these medications   Biotin 10000 MCG Tabs Take 1 tablet by mouth daily.   cetirizine 10 MG tablet Commonly known as:  ZYRTEC Take 10 mg by mouth daily.   CONTRAVE 8-90 MG Tb12 Generic drug:  Naltrexone-Bupropion HCl ER Take 2 tablets by mouth daily.   erythromycin 250 MG tablet Commonly known as:  E-MYCIN Take 250 mg by mouth daily.   estradiol 1 MG tablet Commonly known as:  ESTRACE Take 1 mg by mouth daily.   Fish Oil 1200 MG Caps Take 1,200 mg by mouth daily.   levothyroxine 100 MCG tablet Commonly known as:  SYNTHROID, LEVOTHROID Take 100 mcg by mouth daily before breakfast.   methocarbamol 500 MG tablet Commonly known as:  ROBAXIN Take 1 tablet (500 mg total) every 6 (six) hours as needed by mouth for muscle spasms.   mometasone 50 MCG/ACT nasal spray Commonly known as:  NASONEX Place 1 spray into the nose 2 (two) times daily.   montelukast 10 MG tablet Commonly known  as:  SINGULAIR Take 10 mg by mouth daily.   multivitamin with minerals Tabs tablet Take 1 tablet by mouth daily.   naproxen 500 MG tablet Commonly known as:  NAPROSYN Take 500 mg by mouth 2 (two) times daily with a meal.   oxyCODONE-acetaminophen 5-325 MG tablet Commonly known as:  ROXICET Take 1 tablet every 6 (six) hours as needed by mouth.   ranitidine 150 MG tablet Commonly known as:  ZANTAC Take 150 mg by mouth daily.   SOOLANTRA 1 % Crea Generic drug:  Ivermectin Apply 1 application topically daily.   trimethoprim 100 MG tablet  Commonly known as:  TRIMPEX Take 100 mg by mouth daily.       Disposition: home   Final Dx: same as admitting  Discharge Instructions     Remove dressing in 72 hours   Complete by:  As directed    Call MD for:  difficulty breathing, headache or visual disturbances   Complete by:  As directed    Call MD for:  extreme fatigue   Complete by:  As directed    Call MD for:  hives   Complete by:  As directed    Call MD for:  persistant dizziness or light-headedness   Complete by:  As directed    Call MD for:  persistant nausea and vomiting   Complete by:  As directed    Call MD for:  redness, tenderness, or signs of infection (pain, swelling, redness, odor or green/yellow discharge around incision site)   Complete by:  As directed    Call MD for:  severe uncontrolled pain   Complete by:  As directed    Call MD for:  temperature >100.4   Complete by:  As directed    Diet - low sodium heart healthy   Complete by:  As directed    Driving Restrictions   Complete by:  As directed    No driving one week   Increase activity slowly   Complete by:  As directed    Lifting restrictions   Complete by:  As directed    Nothing more than 8 lbs         Signed: Ocie Cornfield Coy Rochford 12/10/2016, 11:20 AM

## 2017-02-15 ENCOUNTER — Other Ambulatory Visit: Payer: Self-pay | Admitting: Neurological Surgery

## 2017-02-15 DIAGNOSIS — M5412 Radiculopathy, cervical region: Secondary | ICD-10-CM

## 2017-03-22 ENCOUNTER — Ambulatory Visit (INDEPENDENT_AMBULATORY_CARE_PROVIDER_SITE_OTHER): Payer: Worker's Compensation

## 2017-03-22 DIAGNOSIS — M5412 Radiculopathy, cervical region: Secondary | ICD-10-CM

## 2017-03-22 DIAGNOSIS — Z981 Arthrodesis status: Secondary | ICD-10-CM | POA: Diagnosis not present

## 2017-03-30 ENCOUNTER — Other Ambulatory Visit: Payer: Self-pay | Admitting: Neurological Surgery

## 2017-03-30 DIAGNOSIS — M542 Cervicalgia: Secondary | ICD-10-CM

## 2017-04-07 ENCOUNTER — Ambulatory Visit
Admission: RE | Admit: 2017-04-07 | Discharge: 2017-04-07 | Disposition: A | Discharge status: Home / Self Care | Payer: Worker's Compensation | Source: Ambulatory Visit | Attending: Neurological Surgery | Admitting: Neurological Surgery

## 2017-04-07 VITALS — BP 119/68 | HR 76

## 2017-04-07 DIAGNOSIS — M4322 Fusion of spine, cervical region: Secondary | ICD-10-CM

## 2017-04-07 DIAGNOSIS — M542 Cervicalgia: Secondary | ICD-10-CM

## 2017-04-07 MED ORDER — DIAZEPAM 5 MG PO TABS
10.0000 mg | ORAL_TABLET | Freq: Once | ORAL | Status: AC
  Administered 2017-04-07: 10 mg via ORAL

## 2017-04-07 MED ORDER — MEPERIDINE HCL 100 MG/ML IJ SOLN
100.0000 mg | Freq: Once | INTRAMUSCULAR | Status: AC
  Administered 2017-04-07: 100 mg via INTRAMUSCULAR

## 2017-04-07 MED ORDER — IOPAMIDOL (ISOVUE-M 300) INJECTION 61%
9.0000 mL | Freq: Once | INTRAMUSCULAR | Status: AC | PRN
  Administered 2017-04-07: 9 mL via INTRATHECAL

## 2017-04-07 MED ORDER — ONDANSETRON HCL 4 MG/2ML IJ SOLN
4.0000 mg | Freq: Once | INTRAMUSCULAR | Status: AC
  Administered 2017-04-07: 4 mg via INTRAMUSCULAR

## 2017-04-07 MED ORDER — ONDANSETRON HCL 4 MG/2ML IJ SOLN: 4.0000 mg | Freq: Four times a day (QID) | INTRAMUSCULAR | Status: DC | PRN

## 2017-04-07 NOTE — Discharge Instructions (Signed)

## 2017-04-14 ENCOUNTER — Other Ambulatory Visit: Payer: Self-pay

## 2017-06-03 ENCOUNTER — Other Ambulatory Visit: Payer: Self-pay | Admitting: Neurological Surgery

## 2017-06-21 NOTE — Pre-Procedure Instructions (Signed)
Jannis Dargis  06/21/2017      [address] [place] [place] [address] Phone: [phone] Fax: [phone]    Your procedure is scheduled on 06/30/2017.  Report to [facility] Admitting at [place]  Call this number if you have problems the morning of surgery:  [phone]   Remember:  Nothing to eat or drink after midnight the night before your surgery.   Continue all medications as directed by your physician except follow these medication instructions before surgery below    Take these medicines the morning of surgery with A SIP OF WATER: Cetirizine (Zyrtec) Estradiol (Estrace) Levothyroxine (Synthroid) Methocarbamol (Robaxin) - if needed Mometasone (Nasonex) - if needed Montelukast (Singulair) Ranitidine (Zantac) tapentadol (Nucynta) - if needed Trimethoprim (Trimpex)  7 days prior to surgery STOP taking any Lorcaserin HCl (Belviq), Aspirin (unless otherwise instructed by your surgeon), Aleve, Naproxen, Ibuprofen, Motrin, Advil, Goody's, BC's, all herbal medications, fish oil, and all vitamins     Do not wear jewelry, make-up or nail polish.  Do not wear lotions, powders, or perfumes, or deodorant.  Do not shave 48 hours prior to surgery.    Do not bring valuables to the hospital.  [facility] is not responsible for any belongings or valuables.  Hearing aids, eyeglasses, contacts, dentures or bridgework may not be worn into surgery.  Leave your suitcase in the car.  After surgery it may be brought to your room.  For patients admitted to the hospital, discharge time will be determined by your treatment team.  Patients discharged the day of surgery will not be allowed to drive home.   Name and phone number of your driver:    Special instructions:   [facility]- Preparing For Surgery  Before surgery, you can play an important role. Because skin is not sterile, your skin needs to be as free of germs as possible. You  can reduce the number of germs on your skin by washing with CHG (chlorahexidine gluconate) Soap before surgery.  CHG is an antiseptic cleaner which kills germs and bonds with the skin to continue killing germs even after washing.    Oral Hygiene is also important to reduce your risk of infection.  Remember - BRUSH YOUR TEETH THE MORNING OF SURGERY WITH YOUR REGULAR TOOTHPASTE  Please do not use if you have an allergy to CHG or antibacterial soaps. If your skin becomes reddened/irritated stop using the CHG.  Do not shave (including legs and underarms) for at least 48 hours prior to first CHG shower. It is OK to shave your face.  Please follow these instructions carefully.   1. Shower the NIGHT BEFORE SURGERY and the MORNING OF SURGERY with CHG.   2. If you chose to wash your hair, wash your hair first as usual with your normal shampoo.  3. After you shampoo, rinse your hair and body thoroughly to remove the shampoo.  4. Use CHG as you would any other liquid soap. You can apply CHG directly to the skin and wash gently with a scrungie or a clean washcloth.   5. Apply the CHG Soap to your body ONLY FROM THE NECK DOWN.  Do not use on open wounds or open sores. Avoid contact with your eyes, ears, mouth and genitals (private parts). Wash Face and genitals (private parts)  with your normal soap.  6. Wash thoroughly, paying special attention to the area where your surgery will  be performed.  7. Thoroughly rinse your body with warm water from the neck down.  8. DO NOT shower/wash with your normal soap after using and rinsing off the CHG Soap.  9. Pat yourself dry with a CLEAN TOWEL.  10. Wear CLEAN PAJAMAS to bed the night before surgery, wear comfortable clothes the morning of surgery  11. Place CLEAN SHEETS on your bed the night of your first shower and DO NOT SLEEP WITH PETS.    Day of Surgery:  Do not apply any deodorants/lotions.  Please wear clean clothes to the hospital/surgery  center.   Remember to brush your teeth WITH YOUR REGULAR TOOTHPASTE.    Please read over the following fact sheets that you were given.

## 2017-06-22 ENCOUNTER — Encounter (HOSPITAL_COMMUNITY): Payer: Self-pay

## 2017-06-22 ENCOUNTER — Encounter (HOSPITAL_COMMUNITY)
Admission: RE | Admit: 2017-06-22 | Discharge: 2017-06-22 | Disposition: A | Discharge status: Home / Self Care | Payer: No Typology Code available for payment source | Source: Ambulatory Visit | Attending: Neurological Surgery | Admitting: Neurological Surgery

## 2017-06-22 ENCOUNTER — Other Ambulatory Visit: Payer: Self-pay

## 2017-06-22 DIAGNOSIS — Z01812 Encounter for preprocedural laboratory examination: Secondary | ICD-10-CM | POA: Diagnosis present

## 2017-06-22 LAB — CBC WITH DIFFERENTIAL/PLATELET
Abs Immature Granulocytes: 0 10*3/uL (ref 0.0–0.1)
BASOS ABS: 0 10*3/uL (ref 0.0–0.1)
BASOS PCT: 0 %
EOS PCT: 1 %
Eosinophils Absolute: 0.1 10*3/uL (ref 0.0–0.7)
HEMATOCRIT: 43 % (ref 36.0–46.0)
Hemoglobin: 14.6 g/dL (ref 12.0–15.0)
IMMATURE GRANULOCYTES: 0 %
LYMPHS ABS: 1.8 10*3/uL (ref 0.7–4.0)
Lymphocytes Relative: 28 %
MCH: 29.4 pg (ref 26.0–34.0)
MCHC: 34 g/dL (ref 30.0–36.0)
MCV: 86.7 fL (ref 78.0–100.0)
Monocytes Absolute: 0.5 10*3/uL (ref 0.1–1.0)
Monocytes Relative: 8 %
NEUTROS PCT: 63 %
Neutro Abs: 3.9 10*3/uL (ref 1.7–7.7)
PLATELETS: 288 10*3/uL (ref 150–400)
RBC: 4.96 MIL/uL (ref 3.87–5.11)
RDW: 12.1 % (ref 11.5–15.5)
WBC: 6.3 10*3/uL (ref 4.0–10.5)

## 2017-06-22 LAB — BASIC METABOLIC PANEL
Anion gap: 5 (ref 5–15)
BUN: 15 mg/dL (ref 6–20)
CALCIUM: 9.2 mg/dL (ref 8.9–10.3)
CHLORIDE: 110 mmol/L (ref 101–111)
CO2: 24 mmol/L (ref 22–32)
CREATININE: 0.72 mg/dL (ref 0.44–1.00)
Glucose, Bld: 111 mg/dL — ABNORMAL HIGH (ref 65–99)
Potassium: 3.9 mmol/L (ref 3.5–5.1)
SODIUM: 139 mmol/L (ref 135–145)

## 2017-06-22 LAB — PROTIME-INR
INR: 0.94
Prothrombin Time: 12.4 seconds (ref 11.4–15.2)

## 2017-06-22 LAB — SURGICAL PCR SCREEN
MRSA, PCR: NEGATIVE
Staphylococcus aureus: NEGATIVE

## 2017-06-22 NOTE — Progress Notes (Signed)
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 Patient was also instructed that they will need to review over the PAT instructions again at home before surgery.

## 2017-06-30 ENCOUNTER — Other Ambulatory Visit: Payer: Self-pay

## 2017-06-30 ENCOUNTER — Ambulatory Visit (HOSPITAL_COMMUNITY): Payer: No Typology Code available for payment source | Admitting: Certified Registered Nurse Anesthetist

## 2017-06-30 ENCOUNTER — Encounter (HOSPITAL_COMMUNITY)
Admission: RE | Disposition: A | Discharge status: Home / Self Care | Payer: Self-pay | Source: Ambulatory Visit | Attending: Neurological Surgery

## 2017-06-30 ENCOUNTER — Ambulatory Visit (HOSPITAL_COMMUNITY): Payer: No Typology Code available for payment source

## 2017-06-30 ENCOUNTER — Ambulatory Visit (HOSPITAL_COMMUNITY)
Admission: RE | Admit: 2017-06-30 | Discharge: 2017-06-30 | Disposition: A | Discharge status: Home / Self Care | Payer: No Typology Code available for payment source | Source: Ambulatory Visit | Attending: Neurological Surgery | Admitting: Neurological Surgery

## 2017-06-30 ENCOUNTER — Encounter (HOSPITAL_COMMUNITY): Payer: Self-pay | Admitting: General Practice

## 2017-06-30 DIAGNOSIS — M4803 Spinal stenosis, cervicothoracic region: Secondary | ICD-10-CM | POA: Insufficient documentation

## 2017-06-30 DIAGNOSIS — Z419 Encounter for procedure for purposes other than remedying health state, unspecified: Secondary | ICD-10-CM

## 2017-06-30 DIAGNOSIS — Z538 Procedure and treatment not carried out for other reasons: Secondary | ICD-10-CM | POA: Diagnosis not present

## 2017-06-30 DIAGNOSIS — K219 Gastro-esophageal reflux disease without esophagitis: Secondary | ICD-10-CM | POA: Insufficient documentation

## 2017-06-30 DIAGNOSIS — Z79890 Hormone replacement therapy (postmenopausal): Secondary | ICD-10-CM | POA: Diagnosis not present

## 2017-06-30 DIAGNOSIS — Z79899 Other long term (current) drug therapy: Secondary | ICD-10-CM | POA: Insufficient documentation

## 2017-06-30 DIAGNOSIS — M5023 Other cervical disc displacement, cervicothoracic region: Secondary | ICD-10-CM | POA: Diagnosis present

## 2017-06-30 DIAGNOSIS — Z881 Allergy status to other antibiotic agents status: Secondary | ICD-10-CM | POA: Insufficient documentation

## 2017-06-30 DIAGNOSIS — E039 Hypothyroidism, unspecified: Secondary | ICD-10-CM | POA: Diagnosis not present

## 2017-06-30 DIAGNOSIS — Z79810 Long term (current) use of selective estrogen receptor modulators (SERMs): Secondary | ICD-10-CM | POA: Diagnosis not present

## 2017-06-30 DIAGNOSIS — M479 Spondylosis, unspecified: Secondary | ICD-10-CM | POA: Insufficient documentation

## 2017-06-30 DIAGNOSIS — Z791 Long term (current) use of non-steroidal anti-inflammatories (NSAID): Secondary | ICD-10-CM | POA: Diagnosis not present

## 2017-06-30 SURGERY — CANCELLED PROCEDURE
Anesthesia: General

## 2017-06-30 MED ORDER — CHLORHEXIDINE GLUCONATE CLOTH 2 % EX PADS: 6.0000 | MEDICATED_PAD | Freq: Once | CUTANEOUS | Status: DC

## 2017-06-30 MED ORDER — ROCURONIUM BROMIDE 100 MG/10ML IV SOLN
INTRAVENOUS | Status: DC | PRN
  Administered 2017-06-30: 50 mg via INTRAVENOUS

## 2017-06-30 MED ORDER — SUGAMMADEX SODIUM 200 MG/2ML IV SOLN
INTRAVENOUS | Status: AC
  Filled 2017-06-30: qty 2

## 2017-06-30 MED ORDER — CEFAZOLIN SODIUM-DEXTROSE 2-4 GM/100ML-% IV SOLN
2.0000 g | INTRAVENOUS | Status: AC
  Administered 2017-06-30: 2 g via INTRAVENOUS

## 2017-06-30 MED ORDER — CEFAZOLIN SODIUM-DEXTROSE 2-4 GM/100ML-% IV SOLN
INTRAVENOUS | Status: AC
  Filled 2017-06-30: qty 100

## 2017-06-30 MED ORDER — FENTANYL CITRATE (PF) 100 MCG/2ML IJ SOLN: 25.0000 ug | INTRAMUSCULAR | Status: DC | PRN

## 2017-06-30 MED ORDER — PROPOFOL 10 MG/ML IV BOLUS
INTRAVENOUS | Status: AC
  Filled 2017-06-30: qty 40

## 2017-06-30 MED ORDER — DEXAMETHASONE SODIUM PHOSPHATE 4 MG/ML IJ SOLN
INTRAMUSCULAR | Status: DC | PRN
  Administered 2017-06-30: 10 mg via INTRAVENOUS

## 2017-06-30 MED ORDER — FENTANYL CITRATE (PF) 250 MCG/5ML IJ SOLN
INTRAMUSCULAR | Status: DC | PRN
  Administered 2017-06-30: 75 ug via INTRAVENOUS
  Administered 2017-06-30: 50 ug via INTRAVENOUS

## 2017-06-30 MED ORDER — LIDOCAINE HCL (CARDIAC) PF 100 MG/5ML IV SOSY
PREFILLED_SYRINGE | INTRAVENOUS | Status: DC | PRN
  Administered 2017-06-30: 40 mg via INTRAVENOUS

## 2017-06-30 MED ORDER — DEXAMETHASONE SODIUM PHOSPHATE 10 MG/ML IJ SOLN
INTRAMUSCULAR | Status: AC
  Filled 2017-06-30: qty 2

## 2017-06-30 MED ORDER — SUGAMMADEX SODIUM 200 MG/2ML IV SOLN
INTRAVENOUS | Status: DC | PRN
  Administered 2017-06-30: 200.4 mg via INTRAVENOUS

## 2017-06-30 MED ORDER — ROCURONIUM BROMIDE 50 MG/5ML IV SOLN
INTRAVENOUS | Status: AC
  Filled 2017-06-30: qty 4

## 2017-06-30 MED ORDER — SUCCINYLCHOLINE CHLORIDE 200 MG/10ML IV SOSY
PREFILLED_SYRINGE | INTRAVENOUS | Status: AC
  Filled 2017-06-30: qty 10

## 2017-06-30 MED ORDER — MIDAZOLAM HCL 2 MG/2ML IJ SOLN
INTRAMUSCULAR | Status: AC
  Filled 2017-06-30: qty 2

## 2017-06-30 MED ORDER — ONDANSETRON HCL 4 MG/2ML IJ SOLN
INTRAMUSCULAR | Status: AC
  Filled 2017-06-30: qty 4

## 2017-06-30 MED ORDER — EPHEDRINE SULFATE 50 MG/ML IJ SOLN
INTRAMUSCULAR | Status: AC
  Filled 2017-06-30: qty 1

## 2017-06-30 MED ORDER — BUPIVACAINE HCL (PF) 0.25 % IJ SOLN
INTRAMUSCULAR | Status: AC
  Filled 2017-06-30: qty 30

## 2017-06-30 MED ORDER — LACTATED RINGERS IV SOLN
INTRAVENOUS | Status: DC | PRN
  Administered 2017-06-30: 07:00:00 via INTRAVENOUS

## 2017-06-30 MED ORDER — LIDOCAINE 2% (20 MG/ML) 5 ML SYRINGE
INTRAMUSCULAR | Status: AC
  Filled 2017-06-30: qty 5

## 2017-06-30 MED ORDER — DEXAMETHASONE SODIUM PHOSPHATE 10 MG/ML IJ SOLN
INTRAMUSCULAR | Status: AC
  Filled 2017-06-30: qty 1

## 2017-06-30 MED ORDER — THROMBIN 5000 UNITS EX SOLR
CUTANEOUS | Status: AC
  Filled 2017-06-30: qty 5000

## 2017-06-30 MED ORDER — DEXAMETHASONE SODIUM PHOSPHATE 10 MG/ML IJ SOLN: 10.0000 mg | INTRAMUSCULAR | Status: DC

## 2017-06-30 MED ORDER — ONDANSETRON HCL 4 MG/2ML IJ SOLN
INTRAMUSCULAR | Status: DC | PRN
  Administered 2017-06-30: 4 mg via INTRAVENOUS

## 2017-06-30 MED ORDER — PROPOFOL 10 MG/ML IV BOLUS
INTRAVENOUS | Status: DC | PRN
  Administered 2017-06-30: 160 mg via INTRAVENOUS

## 2017-06-30 MED ORDER — PHENYLEPHRINE HCL 10 MG/ML IJ SOLN
INTRAMUSCULAR | Status: DC | PRN
  Administered 2017-06-30: 120 ug via INTRAVENOUS
  Administered 2017-06-30: 80 ug via INTRAVENOUS

## 2017-06-30 MED ORDER — MIDAZOLAM HCL 5 MG/5ML IJ SOLN
INTRAMUSCULAR | Status: DC | PRN
  Administered 2017-06-30: 2 mg via INTRAVENOUS

## 2017-06-30 MED ORDER — FENTANYL CITRATE (PF) 250 MCG/5ML IJ SOLN
INTRAMUSCULAR | Status: AC
  Filled 2017-06-30: qty 5

## 2017-06-30 SURGICAL SUPPLY — 48 items
BAG DECANTER FOR FLEXI CONT (MISCELLANEOUS) IMPLANT
BASKET BONE COLLECTION (BASKET) IMPLANT
BENZOIN TINCTURE PRP APPL 2/3 (GAUZE/BANDAGES/DRESSINGS) IMPLANT
BUR MATCHSTICK NEURO 3.0 LAGG (BURR) IMPLANT
CANISTER SUCT 3000ML PPV (MISCELLANEOUS) IMPLANT
CARTRIDGE OIL MAESTRO DRILL (MISCELLANEOUS) IMPLANT
DIFFUSER DRILL AIR PNEUMATIC (MISCELLANEOUS) IMPLANT
DRAPE C-ARM 42X72 X-RAY (DRAPES) IMPLANT
DRAPE LAPAROTOMY 100X72 PEDS (DRAPES) IMPLANT
DRAPE MICROSCOPE LEICA (MISCELLANEOUS) IMPLANT
DURAPREP 6ML APPLICATOR 50/CS (WOUND CARE) IMPLANT
ELECT COATED BLADE 2.86 ST (ELECTRODE) IMPLANT
ELECT REM PT RETURN 9FT ADLT (ELECTROSURGICAL)
ELECTRODE REM PT RTRN 9FT ADLT (ELECTROSURGICAL) IMPLANT
GAUZE SPONGE 4X4 16PLY XRAY LF (GAUZE/BANDAGES/DRESSINGS) IMPLANT
GLOVE BIO SURGEON STRL SZ7 (GLOVE) IMPLANT
GLOVE BIO SURGEON STRL SZ8 (GLOVE) IMPLANT
GLOVE BIOGEL PI IND STRL 6.5 (GLOVE) IMPLANT
GLOVE BIOGEL PI IND STRL 7.0 (GLOVE) IMPLANT
GLOVE BIOGEL PI IND STRL 7.5 (GLOVE) IMPLANT
GLOVE BIOGEL PI INDICATOR 6.5 (GLOVE)
GLOVE BIOGEL PI INDICATOR 7.0 (GLOVE)
GLOVE BIOGEL PI INDICATOR 7.5 (GLOVE)
GLOVE SURG SS PI 6.5 STRL IVOR (GLOVE) IMPLANT
GLOVE SURG SS PI 7.5 STRL IVOR (GLOVE) IMPLANT
GOWN STRL REUS W/ TWL LRG LVL3 (GOWN DISPOSABLE) IMPLANT
GOWN STRL REUS W/ TWL XL LVL3 (GOWN DISPOSABLE) IMPLANT
GOWN STRL REUS W/TWL 2XL LVL3 (GOWN DISPOSABLE) IMPLANT
GOWN STRL REUS W/TWL LRG LVL3 (GOWN DISPOSABLE)
GOWN STRL REUS W/TWL XL LVL3 (GOWN DISPOSABLE)
HEMOSTAT POWDER KIT SURGIFOAM (HEMOSTASIS) IMPLANT
KIT BASIN OR (CUSTOM PROCEDURE TRAY) IMPLANT
KIT TURNOVER KIT B (KITS) IMPLANT
NEEDLE HYPO 25X1 1.5 SAFETY (NEEDLE) IMPLANT
NEEDLE SPNL 20GX3.5 QUINCKE YW (NEEDLE) IMPLANT
NS IRRIG 1000ML POUR BTL (IV SOLUTION) IMPLANT
OIL CARTRIDGE MAESTRO DRILL (MISCELLANEOUS)
PACK LAMINECTOMY NEURO (CUSTOM PROCEDURE TRAY) IMPLANT
PAD ARMBOARD 7.5X6 YLW CONV (MISCELLANEOUS) IMPLANT
PIN DISTRACTION 14MM (PIN) IMPLANT
RUBBERBAND STERILE (MISCELLANEOUS) IMPLANT
SPONGE INTESTINAL PEANUT (DISPOSABLE) IMPLANT
SPONGE SURGIFOAM ABS GEL SZ50 (HEMOSTASIS) IMPLANT
STRIP CLOSURE SKIN 1/2X4 (GAUZE/BANDAGES/DRESSINGS) IMPLANT
SUT VIC AB 3-0 SH 8-18 (SUTURE) IMPLANT
TOWEL GREEN STERILE (TOWEL DISPOSABLE) IMPLANT
TOWEL GREEN STERILE FF (TOWEL DISPOSABLE) IMPLANT
WATER STERILE IRR 1000ML POUR (IV SOLUTION) IMPLANT

## 2017-06-30 NOTE — Anesthesia Postprocedure Evaluation (Signed)
Anesthesia Post Note  Patient: Paige Hess  Procedure(s) Performed: ACDF - C7-T1 (N/A )     Patient location during evaluation: PACU Anesthesia Type: General Level of consciousness: awake Pain management: pain level controlled Respiratory status: spontaneous breathing Cardiovascular status: stable Anesthetic complications: no    Last Vitals:  Vitals:   06/30/17 0900 06/30/17 0910  BP: 126/64 130/74  Pulse: 67 73  Resp: (!) 21 18  Temp: 36.5 C   SpO2: 100% 97%    Last Pain:  Vitals:   06/30/17 0910  TempSrc:   PainSc: 0-No pain                 [name]

## 2017-06-30 NOTE — Anesthesia Preprocedure Evaluation (Addendum)
Anesthesia Evaluation  Patient identified by MRN, date of birth, ID band Patient awake    Reviewed: Allergy & Precautions, NPO status , Patient's Chart, lab work & pertinent test results  Airway Mallampati: II  TM Distance: >3 FB     Dental   Pulmonary pneumonia,    breath sounds clear to auscultation       Cardiovascular negative cardio ROS   Rhythm:Regular Rate:Normal     Neuro/Psych    GI/Hepatic GERD  ,  Endo/Other  Hypothyroidism   Renal/GU Renal disease     Musculoskeletal  (+) Arthritis ,   Abdominal   Peds  Hematology   Anesthesia Other Findings   Reproductive/Obstetrics                            Anesthesia Physical Anesthesia Plan  ASA: III  Anesthesia Plan: General   Post-op Pain Management:    Induction: Intravenous  PONV Risk Score and Plan: Treatment may vary due to age or medical condition  Airway Management Planned: Oral ETT  Additional Equipment:   Intra-op Plan:   Post-operative Plan: Possible Post-op intubation/ventilation  Informed Consent: I have reviewed the patients History and Physical, chart, labs and discussed the procedure including the risks, benefits and alternatives for the proposed anesthesia with the patient or authorized representative who has indicated his/her understanding and acceptance.   Dental advisory given  Plan Discussed with: CRNA and Anesthesiologist  Anesthesia Plan Comments:         Anesthesia Quick Evaluation

## 2017-06-30 NOTE — Anesthesia Postprocedure Evaluation (Signed)
Anesthesia Post Note  Patient: Paige Hess  Procedure(s) Performed: ACDF - C7-T1 (N/A )     Patient location during evaluation: PACU Anesthesia Type: General Level of consciousness: awake Pain management: pain level controlled Vital Signs Assessment: post-procedure vital signs reviewed and stable Respiratory status: spontaneous breathing Cardiovascular status: stable Anesthetic complications: no    Last Vitals:  Vitals:   06/30/17 0900 06/30/17 0910  BP: 126/64 130/74  Pulse: 67 73  Resp: (!) 21 18  Temp: 36.5 C   SpO2: 100% 97%    Last Pain:  Vitals:   06/30/17 0910  TempSrc:   PainSc: 0-No pain                 Cornelia Walraven

## 2017-06-30 NOTE — Progress Notes (Signed)
Dr Ronnald Ramp at bedside explaind to pt surgery cancelled and why pt undersatns and will d/c home with returnar later date for ct

## 2017-06-30 NOTE — H&P (Signed)
Subjective:   Patient is a 53 y.o. female admitted for ACDF. The patient first presented to me with complaints of arm pain, shooting pains in the arm(s) and numbness of the arm(s). Onset of symptoms was several months ago. The pain is described as sharp and stabbing and occurs all day. The pain is rated severe, and is located in the neck and radiates to the LUE. The symptoms have been progressive. Symptoms are exacerbated by extending head backwards, and are relieved by none.  Previous work up includes CT of cervical spine, results: pseudoarthrosis with hardware failure.  Past Medical History:  Diagnosis Date  . Adrenal tumor   . Arthritis   . Chronic UTI   . Environmental and seasonal allergies   . GERD (gastroesophageal reflux disease)   . Headache   . History of kidney stones   . 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 Hypoglycemia   . Hypothyroidism   . Kidney infection   . Pneumonia    as a child   . PONV (postoperative nausea and vomiting)   . Renal cyst, right     PMH: has resolved  . Scoliosis   . Stenosis, cervical spine   . Wears contact lenses     Past Surgical History:  Procedure Laterality Date  . ABDOMINAL HYSTERECTOMY     partial  . ADRENALECTOMY    . BACK SURGERY     cervical laminectomy  . CHOLECYSTECTOMY    . colonoscopy    . NASAL SINUS SURGERY    . POSTERIOR CERVICAL FUSION/FORAMINOTOMY Left 12/09/2016   Procedure: Posterior Cervical Fusion with lateral mass fixation - Cervical Seven-Thoracic One, foraminotomy Cerival Seven-Thoracic One left;  Surgeon: Eustace Moore, MD;  Location: [facility];  Service: Neurosurgery;  Laterality: Left;  . TONSILLECTOMY    . TYMPANOSTOMY TUBE PLACEMENT    . WISDOM TOOTH EXTRACTION      Allergies  Allergen Reactions  . Minocycline Hcl Hives and Swelling    SWELLING REACTION UNSPECIFIED     Social History   Tobacco Use  . Smoking status: Never Smoker  . Smokeless tobacco: Never Used  Substance Use Topics  .  Alcohol use: No    Frequency: Never    Family History  Problem Relation Age of Onset  . Aneurysm Mother   . Hypertension Mother   . Diabetes Father   . Hypothyroidism Father   . Alzheimer's disease Father   . High Cholesterol Brother   . Hypertension Brother   . Hypertension Brother   . High Cholesterol Brother    Prior to Admission medications   Medication Sig Start Date End Date Taking? Authorizing Provider  Biotin 10000 MCG TABS Take 10,000 mcg by mouth daily.    Yes [provider]  cetirizine (ZYRTEC) 10 MG tablet Take 10 mg by mouth daily.   Yes [provider]  Cholecalciferol (VITAMIN D3) 5000 units CAPS Take 5,000 Units by mouth daily.   Yes [provider]  docusate sodium (COLACE) 100 MG capsule Take 100 mg by mouth every other day.   Yes [provider]  estradiol (ESTRACE) 1 MG tablet Take 1 mg by mouth daily.   Yes [provider]  Ivermectin (SOOLANTRA) 1 % CREA Apply 1 application topically daily.   Yes [provider]  levothyroxine (SYNTHROID, LEVOTHROID) 100 MCG tablet Take 100 mcg by mouth daily before breakfast.   Yes [provider]  Lorcaserin HCl (BELVIQ) 10 MG  TABS Take 10 mg by mouth daily.    Yes [provider]  Menaquinone-7 (VITAMIN K2 PO) Take 1 tablet by mouth daily.   Yes [provider]  methocarbamol (ROBAXIN) 500 MG tablet Take 1 tablet (500 mg total) every 6 (six) hours as needed by mouth for muscle spasms. 12/10/16  Yes Meyran, Ocie Cornfield, NP  mometasone (NASONEX) 50 MCG/ACT nasal spray Place 1 spray into the nose 2 (two) times daily as needed (allergies).    Yes [provider]  montelukast (SINGULAIR) 10 MG tablet Take 10 mg by mouth daily.   Yes [provider]  Multiple Vitamin (MULTIVITAMIN WITH MINERALS) TABS tablet Take 1 tablet by mouth daily.   Yes [provider]  naproxen (NAPROSYN) 500 MG tablet Take 500 mg by mouth daily. May  take a second 500 mg dose as needed for pain   Yes [provider]  ranitidine (ZANTAC) 150 MG tablet Take 150 mg by mouth daily.   Yes [provider]  spironolactone (ALDACTONE) 25 MG tablet Take 25 mg by mouth daily.   Yes [provider]  tapentadol (NUCYNTA) 50 MG tablet Take 50 mg by mouth 2 (two) times daily as needed for severe pain.   Yes [provider]  trimethoprim (TRIMPEX) 100 MG tablet Take 100 mg by mouth daily.   Yes [provider]  oxyCODONE-acetaminophen (ROXICET) 5-325 MG tablet Take 1 tablet every 6 (six) hours as needed by mouth. Patient not taking: Reported on 06/15/2017 12/10/16 12/10/17  Eleonore Chiquito, NP     Review of Systems  Positive ROS: neg  All other systems have been reviewed and were otherwise negative with the exception of those mentioned in the HPI and as above.  Objective: Vital signs in last 24 hours: Temp:  [98.1 F (36.7 C)] 98.1 F (36.7 C) (05/30 0607) Pulse Rate:  [67] 67 (05/30 0607) Resp:  [20] 20 (05/30 0607) BP: (129)/(84) 129/84 (05/30 0607) SpO2:  [100 %] 100 % (05/30 0607) Weight:  [100.2 kg (221 lb)] 100.2 kg (221 lb) (05/30 0607)  General Appearance: Alert, cooperative, no distress, appears stated age Head: Normocephalic, without obvious abnormality, atraumatic Eyes: PERRL, conjunctiva/corneas clear, EOM's intact      Neck: Supple, symmetrical, trachea midline, Back: Symmetric, no curvature, ROM normal, no CVA tenderness Lungs:  respirations unlabored Heart: Regular rate and rhythm Abdomen: Soft, non-tender Extremities: Extremities normal, atraumatic, no cyanosis or edema Pulses: 2+ and symmetric all extremities Skin: Skin color, texture, turgor normal, no rashes or lesions  NEUROLOGIC:  Mental status: Alert and oriented x4, no aphasia, good attention span, fund of knowledge and memory  Motor Exam - grossly normal Sensory Exam - grossly normal Reflexes: 1+ Coordination -  grossly normal Gait - grossly normal Balance - grossly normal Cranial Nerves: I: smell Not tested  II: visual acuity  OS: nl    OD: nl  II: visual fields Full to confrontation  II: pupils Equal, round, reactive to light  III,VII: ptosis None  III,IV,VI: extraocular muscles  Full ROM  V: mastication Normal  V: facial light touch sensation  Normal  V,VII: corneal reflex  Present  VII: facial muscle function - upper  Normal  VII: facial muscle function - lower Normal  VIII: hearing Not tested  IX: soft palate elevation  Normal  IX,X: gag reflex Present  XI: trapezius strength  5/5  XI: sternocleidomastoid strength 5/5  XI: neck flexion strength  5/5  XII: tongue strength  Normal  Data Review Lab Results  Component Value Date   WBC 6.3 06/22/2017   HGB 14.6 06/22/2017   HCT 43.0 06/22/2017   MCV 86.7 06/22/2017   PLT 288 06/22/2017   Lab Results  Component Value Date   NA 139 06/22/2017   K 3.9 06/22/2017   CL 110 06/22/2017   CO2 24 06/22/2017   BUN 15 06/22/2017   CREATININE 0.72 06/22/2017   GLUCOSE 111 (H) 06/22/2017   Lab Results  Component Value Date   INR 0.94 06/22/2017    Assessment:   Cervical neck pain with herniated nucleus pulposus/ 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 Estimated body mass index is 39.15 kg/m as calculated from the following:   Height as of this encounter: 5\' 3"  (1.6 m).   Weight as of this encounter: 100.2 kg (221 lb).  Patient has failed conservative therapy. Planned surgery : ACDF C7-T1  Plan:   I explained the condition and procedure to the patient and answered any questions.  Patient wishes to proceed with procedure as planned. Understands risks/ benefits/ and expected or typical outcomes.  Marilu Rylander S 06/30/2017 7:39 AM

## 2017-06-30 NOTE — Transfer of Care (Signed)
Immediate Anesthesia Transfer of Care Note  Patient: Paige Hess  Procedure(s) Performed: ACDF - C7-T1 (N/A )  Patient Location: PACU  Anesthesia Type:General  Level of Consciousness: awake, alert , oriented, patient cooperative and responds to stimulation  Airway & Oxygen Therapy: Patient Spontanous Breathing and Patient connected to nasal cannula oxygen  Post-op Assessment: Report given to RN, Post -op Vital signs reviewed and stable and Patient moving all extremities X 4  Post vital signs: Reviewed and stable  Last Vitals:  Vitals Value Taken Time  BP 118/77 06/30/2017  8:45 AM  Temp    Pulse 77 06/30/2017  8:46 AM  Resp 17 06/30/2017  8:46 AM  SpO2 100 % 06/30/2017  8:46 AM  Vitals shown include unvalidated device data.  Last Pain:  Vitals:   06/30/17 0700  TempSrc:   PainSc: 5       Patients Stated Pain Goal: 3 (70/62/37 [id number])  Complications: No apparent anesthesia complications

## 2017-06-30 NOTE — Progress Notes (Signed)
Patient ID: Paige Hess, female   DOB: 05-29-1965, 52 y.o.   MRN: [id number] Patient was positioned for surgery for left-sided approach for C7-T1 ACDF. Lateral fluoroscopy was used to try to identify the incision site, and unfortunately C7-T1 rests too deep in her chest to make an anterior approach feasible. Even the top of the manubrium only got me to C7. The lordotic angle would make it extremely difficult to do a C7-T1 ACDF from an anterior approach without opening the manubrium. I brought in Dr. Saintclair Halsted and we went over the fluoroscopic images together we both felt that the surgery would not be feasible from an anterior approach and the risks of this approach well with the potential benefits. Therefore we will cancel surgery.. Fortunately, this was all discussed with her in the office at her preoperative visit when we went over her imaging, C7-T1 disc space was at the level of the manubrium and clavicle and I explained to her that it may be very difficult to do this from an anterior approach, but it became much more apparent after positioning today that it would not be feasible or safe to try at this time.

## 2017-06-30 NOTE — Anesthesia Procedure Notes (Signed)
Procedure Name: Intubation Date/Time: 06/30/2017 8:02 AM Performed by: Glynda Jaeger, CRNA Pre-anesthesia Checklist: Patient identified, Patient being monitored, Timeout performed, Emergency Drugs available and Suction available Patient Re-evaluated:Patient Re-evaluated prior to induction Oxygen Delivery Method: Circle System Utilized Preoxygenation: Pre-oxygenation with 100% oxygen Induction Type: IV induction Ventilation: Mask ventilation without difficulty Laryngoscope Size: Mac and 4 Grade View: Grade I Tube type: Oral Tube size: 7.5 mm Number of attempts: 1 Airway Equipment and Method: Stylet Placement Confirmation: ETT inserted through vocal cords under direct vision,  positive ETCO2 and breath sounds checked- equal and bilateral Secured at: 21 cm Tube secured with: Tape Dental Injury: Teeth and Oropharynx as per pre-operative assessment

## 2017-07-04 ENCOUNTER — Other Ambulatory Visit: Payer: Self-pay | Admitting: Neurological Surgery

## 2017-07-04 ENCOUNTER — Ambulatory Visit
Admission: RE | Admit: 2017-07-04 | Discharge: 2017-07-04 | Disposition: A | Discharge status: Home / Self Care | Payer: Self-pay | Source: Ambulatory Visit | Attending: Neurological Surgery | Admitting: Neurological Surgery

## 2017-07-04 DIAGNOSIS — T148XXA Other injury of unspecified body region, initial encounter: Secondary | ICD-10-CM

## 2018-10-27 ENCOUNTER — Other Ambulatory Visit: Payer: Self-pay | Admitting: Neurological Surgery

## 2018-10-27 DIAGNOSIS — M5412 Radiculopathy, cervical region: Secondary | ICD-10-CM

## 2018-10-29 ENCOUNTER — Other Ambulatory Visit: Payer: Self-pay

## 2018-11-11 ENCOUNTER — Ambulatory Visit
Admission: RE | Admit: 2018-11-11 | Discharge: 2018-11-11 | Disposition: A | Discharge status: Home / Self Care | Payer: BC Managed Care – PPO | Source: Ambulatory Visit | Attending: Neurological Surgery | Admitting: Neurological Surgery

## 2018-11-11 ENCOUNTER — Other Ambulatory Visit: Payer: Self-pay

## 2018-11-11 DIAGNOSIS — M5412 Radiculopathy, cervical region: Secondary | ICD-10-CM

## 2019-02-11 IMAGING — CT CT CERVICAL SPINE W/O CM
2 series · 10 of 14 positions shown, 12 images · non-contrast
Comparison: 04/07/2017

CLINICAL DATA: Follow-up fracture after fall.  Subsequent encounter

EXAM:
CT CERVICAL SPINE WITHOUT CONTRAST
TECHNIQUE: Multidetector CT imaging of the cervical spine was performed without
intravenous contrast. Multiplanar CT image reconstructions were also
generated.

[Series 3: cspine soft · axial · 0.32mm/px · z∈[-245,-117]mm · 5 of 97 slices shown]
[im 17/97  soft-tissue]
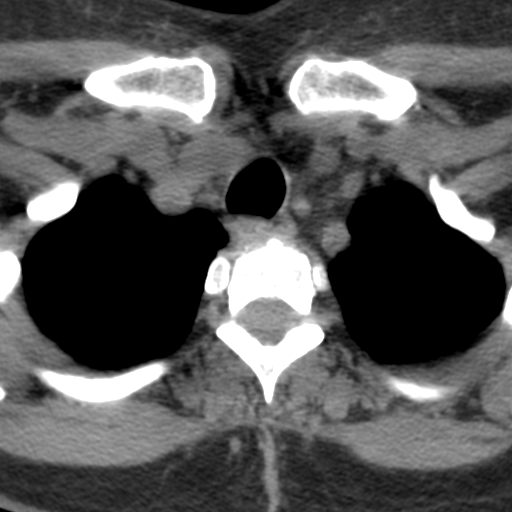
[im 33/97  soft-tissue]
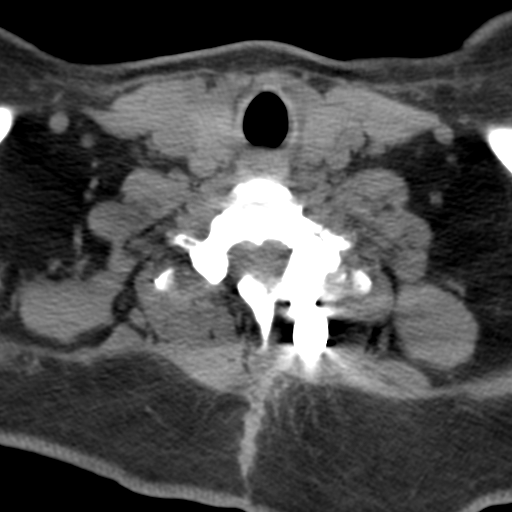
[im 49/97  soft-tissue]
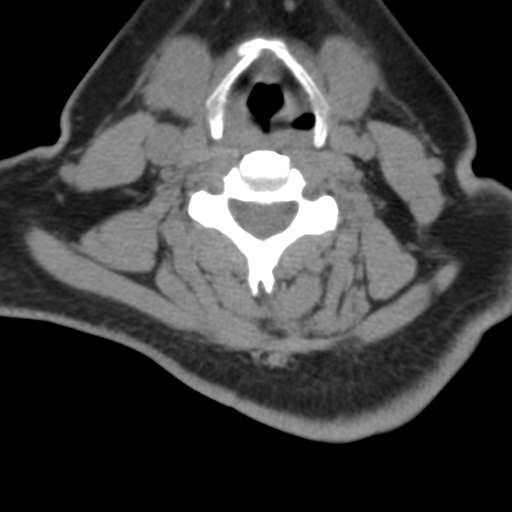
[im 65/97  soft-tissue]
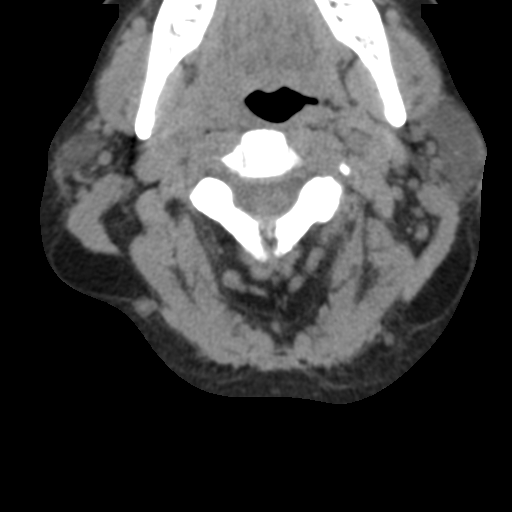
[im 81/97  soft-tissue]
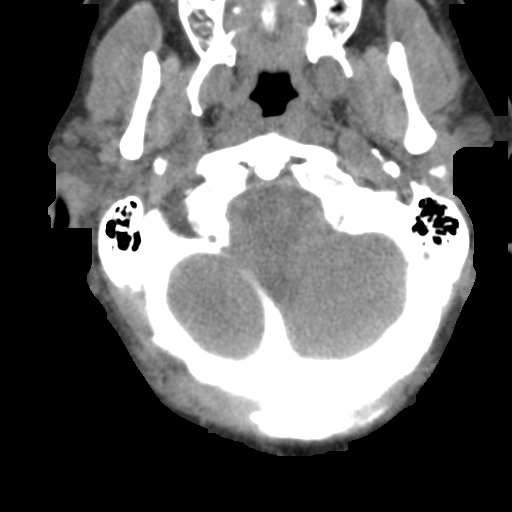

[Series 9: angled axial · axial · 0.28mm/px · z∈[-270,-139]mm · 5 of 105 slices shown, 7 images]
[im 18/105  soft-tissue]
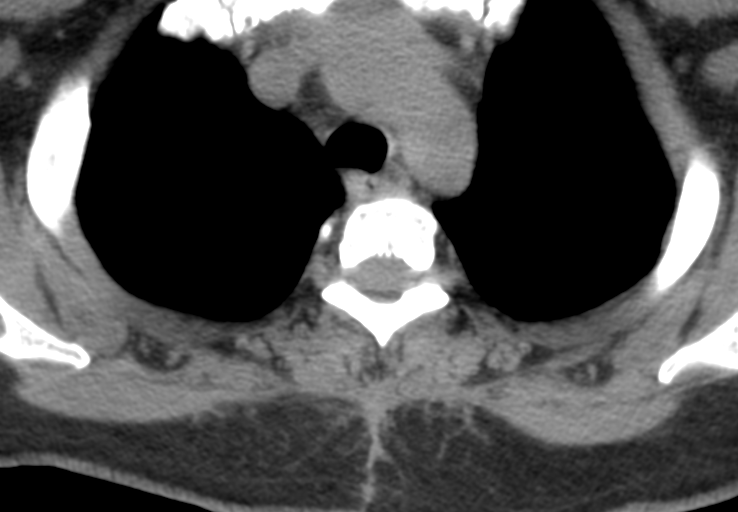
[im 18/105  bone]
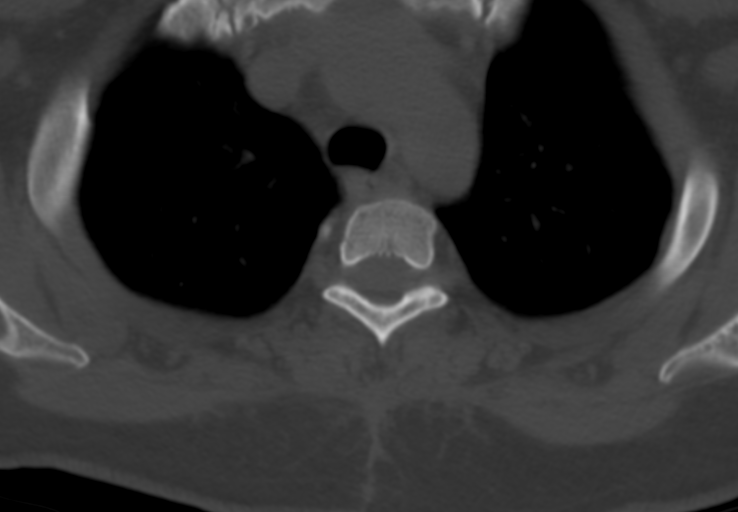
[im 35/105  bone]
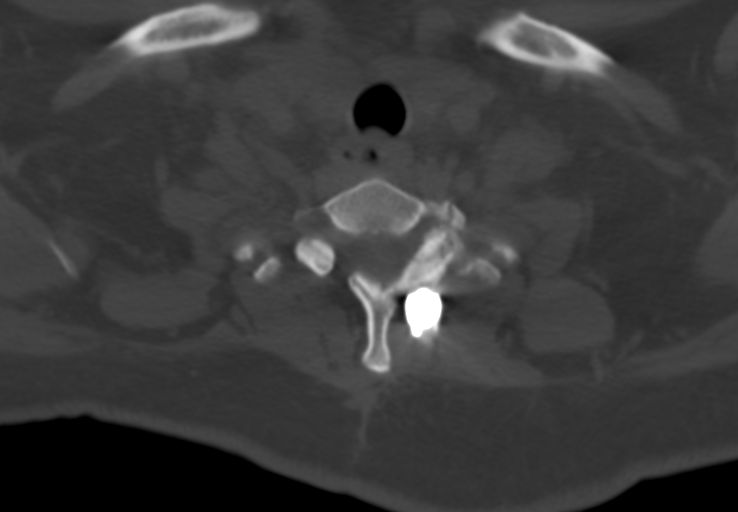
[im 53/105  bone]
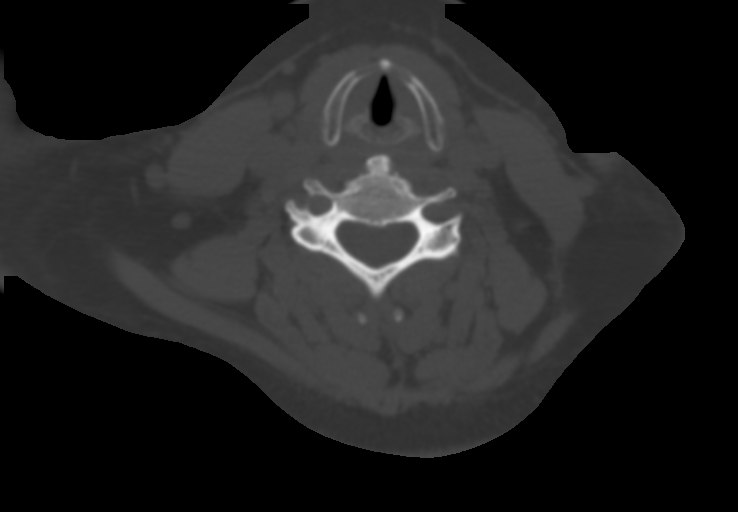
[im 70/105  bone]
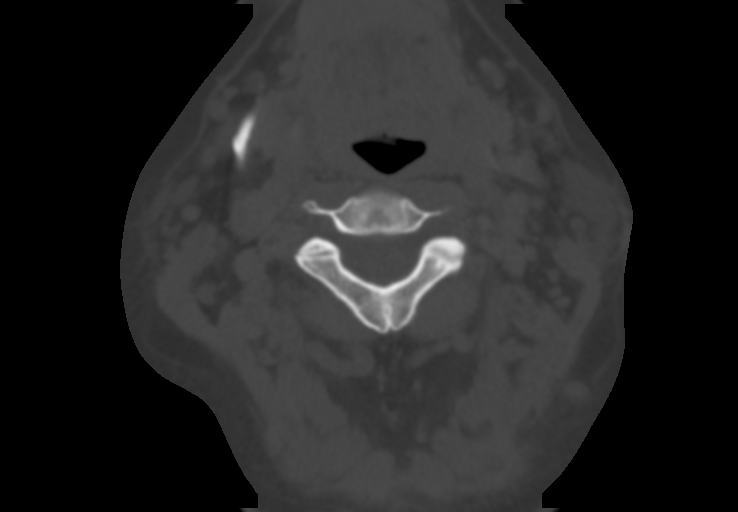
[im 87/105  soft-tissue]
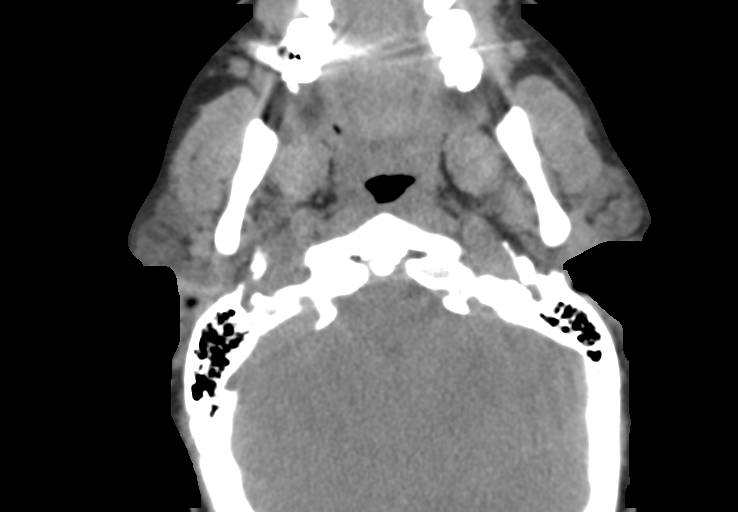
[im 87/105  bone]
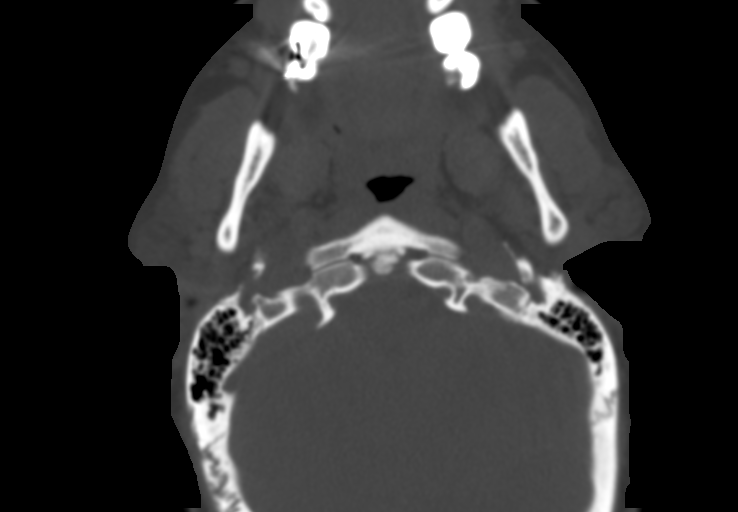

[10 of 14 positions shown; findings below may reference images not displayed]

FINDINGS: Alignment: Normal

Skull base and vertebrae: Status post C7-T1 left rod and screw
fixation with right-sided laminotomy. The left retrosomatic
cleft/chronic fracture at C7 is unchanged. The fracture at the base
of the T1 left transverse process has healed. Hardware is in stable
position. No solid arthrodesis.

Soft tissues and spinal canal: Scarring related to surgery. No fluid
collection.

Disc levels: Disc narrowing with mild endplate ridging at C4-5 to
C6-7. No progression since prior. No evidence of cord impingement.

Left C7-T1 facet spurring narrows the left foramen.

Upper chest: Negative
IMPRESSION: 1. C7 left retro somatic cleft remains open.
2. T1 left transverse process fracture has healed.
3. No demonstrable arthrodesis at the left C7-T1 facet. Stable
hardware positioning.
4. Stable left C7-T1 foraminal narrowing.
